# Patient Record
Sex: Male | Born: 2000 | Race: White | Hispanic: No | Marital: Single | Smoking: Never smoker
Health system: Southern US, Community
[De-identification: ages and names within clinical notes are randomized; demographics above are authoritative.]

## PROBLEM LIST (undated history)

## (undated) DIAGNOSIS — K589 Irritable bowel syndrome without diarrhea: Secondary | ICD-10-CM

## (undated) DIAGNOSIS — F419 Anxiety disorder, unspecified: Secondary | ICD-10-CM

## (undated) DIAGNOSIS — J302 Other seasonal allergic rhinitis: Secondary | ICD-10-CM

## (undated) DIAGNOSIS — K219 Gastro-esophageal reflux disease without esophagitis: Secondary | ICD-10-CM

## (undated) DIAGNOSIS — F32A Depression, unspecified: Secondary | ICD-10-CM

## (undated) DIAGNOSIS — F909 Attention-deficit hyperactivity disorder, unspecified type: Secondary | ICD-10-CM

## (undated) HISTORY — DX: Depression, unspecified: F32.A

## (undated) HISTORY — DX: Gastro-esophageal reflux disease without esophagitis: K21.9

## (undated) HISTORY — DX: Anxiety disorder, unspecified: F41.9

---
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
## 2019-05-09 ENCOUNTER — Other Ambulatory Visit: Payer: Self-pay

## 2019-05-09 DIAGNOSIS — Z20822 Contact with and (suspected) exposure to covid-19: Secondary | ICD-10-CM

## 2019-05-10 LAB — NOVEL CORONAVIRUS, NAA: SARS-CoV-2, NAA: NOT DETECTED

## 2019-10-03 ENCOUNTER — Telehealth (HOSPITAL_COMMUNITY): Payer: Self-pay | Admitting: Family Medicine

## 2019-10-03 ENCOUNTER — Encounter (HOSPITAL_COMMUNITY): Payer: Self-pay

## 2019-10-03 ENCOUNTER — Other Ambulatory Visit: Payer: Self-pay

## 2019-10-03 ENCOUNTER — Ambulatory Visit (HOSPITAL_COMMUNITY)
Admission: EM | Admit: 2019-10-03 | Discharge: 2019-10-03 | Disposition: A | Discharge status: Home / Self Care | Payer: No Typology Code available for payment source | Attending: Family Medicine | Admitting: Family Medicine

## 2019-10-03 DIAGNOSIS — E559 Vitamin D deficiency, unspecified: Secondary | ICD-10-CM | POA: Insufficient documentation

## 2019-10-03 DIAGNOSIS — R5383 Other fatigue: Secondary | ICD-10-CM | POA: Diagnosis not present

## 2019-10-03 LAB — CBC WITH DIFFERENTIAL/PLATELET
Abs Immature Granulocytes: 0.03 10*3/uL (ref 0.00–0.07)
Basophils Absolute: 0 10*3/uL (ref 0.0–0.1)
Basophils Relative: 0 %
Eosinophils Absolute: 0.1 10*3/uL (ref 0.0–0.5)
Eosinophils Relative: 1 %
HCT: 54 % — ABNORMAL HIGH (ref 39.0–52.0)
Hemoglobin: 18.1 g/dL — ABNORMAL HIGH (ref 13.0–17.0)
Immature Granulocytes: 0 %
Lymphocytes Relative: 37 %
Lymphs Abs: 2.5 10*3/uL (ref 0.7–4.0)
MCH: 28 pg (ref 26.0–34.0)
MCHC: 33.5 g/dL (ref 30.0–36.0)
MCV: 83.5 fL (ref 80.0–100.0)
Monocytes Absolute: 0.6 10*3/uL (ref 0.1–1.0)
Monocytes Relative: 9 %
Neutro Abs: 3.6 10*3/uL (ref 1.7–7.7)
Neutrophils Relative %: 53 %
Platelets: 257 10*3/uL (ref 150–400)
RBC: 6.47 MIL/uL — ABNORMAL HIGH (ref 4.22–5.81)
RDW: 12.9 % (ref 11.5–15.5)
WBC: 6.9 10*3/uL (ref 4.0–10.5)
nRBC: 0 % (ref 0.0–0.2)

## 2019-10-03 LAB — COMPREHENSIVE METABOLIC PANEL
ALT: 18 U/L (ref 0–44)
AST: 20 U/L (ref 15–41)
Albumin: 4.6 g/dL (ref 3.5–5.0)
Alkaline Phosphatase: 56 U/L (ref 38–126)
Anion gap: 10 (ref 5–15)
BUN: 13 mg/dL (ref 6–20)
CO2: 28 mmol/L (ref 22–32)
Calcium: 9.8 mg/dL (ref 8.9–10.3)
Chloride: 101 mmol/L (ref 98–111)
Creatinine, Ser: 1.21 mg/dL (ref 0.61–1.24)
GFR calc Af Amer: >60 mL/min (ref >60)
GFR calc non Af Amer: >60 mL/min (ref >60)
Glucose, Bld: 101 mg/dL — ABNORMAL HIGH (ref 70–99)
Potassium: 3.9 mmol/L (ref 3.5–5.1)
Sodium: 139 mmol/L (ref 135–145)
Total Bilirubin: 1.5 mg/dL — ABNORMAL HIGH (ref 0.3–1.2)
Total Protein: 6.8 g/dL (ref 6.5–8.1)

## 2019-10-03 LAB — VITAMIN D 25 HYDROXY (VIT D DEFICIENCY, FRACTURES): Vit D, 25-Hydroxy: 19.65 ng/mL — ABNORMAL LOW (ref 30–100)

## 2019-10-03 LAB — TSH: TSH: 1.609 u[IU]/mL (ref 0.350–4.500)

## 2019-10-03 MED ORDER — VITAMIN D (ERGOCALCIFEROL) 1.25 MG (50000 UNIT) PO CAPS: 50000.0000 [IU] | ORAL_CAPSULE | ORAL | 0 refills | Status: AC

## 2019-10-03 MED FILL — VIT D2 1.25 MG (50,000 UNIT: 1.25 MG | 56 days supply | Qty: 8 | Fill #0

## 2019-10-03 NOTE — ED Triage Notes (Signed)
Pt c/o acute onset of extreme fatigue, sleeping most Monday and Tuesday, lack of appetite/desire to eat, and HA. Generalized abdom pain onset Tuesday.  Denies n/v/d, fever, sore throat, ear pain, joint pain, nasal congestion or dysuria sx.  Denies any recent stress.

## 2019-10-03 NOTE — Discharge Instructions (Addendum)
Nothing concerning on your exam today.  We are checking some lab work to rule out other causes of fatigue. Follow-up with your primary care for any continued problems

## 2019-10-03 NOTE — ED Provider Notes (Signed)
[facility]    CSN: [id number] Arrival date & time: 10/03/19  2094      History   Chief Complaint Chief Complaint  Patient presents with  . Fatigue    HPI Jermaine Blackwell is a 19 y.o. male.   Patient is a 19 year old male presents today for acute onset of extreme fatigue for the past couple days.  Reporting sleeping most of the day Monday and Tuesday.  He has had lack of appetite and desire to eat.  Mild headache.  Generalized abdominal discomfort on Tuesday.  Denies any associated nausea, vomiting or diarrhea.  Denies any fever, sore throat, ear pain, joint pain, nasal congestion or rhinorrhea.  Denies any recent stressful situations or anxiety.  No chest pain or shortness of breath.  Past medical history of ADD, anxiety, IBS and seasonal allergies.  ROS per HPI      Past Medical History:  Diagnosis Date  . ADD (attention deficit disorder with hyperactivity)   . Anxiety   . IBS (irritable bowel syndrome)   . Seasonal allergies     Patient Active Problem List   Diagnosis Date Noted  . Generalized anxiety disorder 04/20/2018  . Abdominal pain 03/20/2018    History reviewed. No pertinent surgical history.     Home Medications    Prior to Admission medications   Medication Sig Start Date End Date Taking? Authorizing Provider  ANASPAZ 0.125 MG TBDP disintergrating tablet TAKE 1 TABLET BY MOUTH EVERY 4 HOURS AS NEEDED FOR ABDOMINAL CRAMPING CAN REPEAT ONCE IN 30 MINUTES IFNO RELIEF 03/02/18   [provider]  benzonatate (TESSALON PERLES) 100 MG capsule Take 1 capsule (100 mg total) by mouth 3 (three) times daily as needed. 09/11/18   Junie Spencer, FNP  fluticasone (FLONASE) 50 MCG/ACT nasal spray Place 2 sprays into both nostrils daily. 09/11/18   Junie Spencer, FNP    Family History Family History  Problem Relation Age of Onset  . Diabetes Mother   . Anxiety disorder Mother   . Colitis Mother   . Lactose intolerance Mother   . Diabetes  Father   . Hypertension Father   . Gallstones Paternal Grandfather   . Bipolar disorder Maternal Grandmother   . Depression Paternal Grandmother     Social History Social History   Tobacco Use  . Smoking status: Never Smoker  . Smokeless tobacco: Never Used  Substance Use Topics  . Alcohol use: No  . Drug use: No     Allergies   Patient has no known allergies.   Review of Systems Review of Systems   Physical Exam Triage Vital Signs ED Triage Vitals  Enc Vitals Group     BP 10/03/19 0829 118/82     Pulse Rate 10/03/19 0829 87     Resp 10/03/19 0829 18     Temp 10/03/19 0829 98 F (36.7 C)     Temp Source 10/03/19 0829 Oral     SpO2 10/03/19 0829 99 %     Weight --      Height --      Head Circumference --      Peak Flow --      Pain Score 10/03/19 0830 4     Pain Loc --      Pain Edu? --      Excl. in GC? --    No data found.  Updated Vital Signs BP 118/82 (BP Location: Left Arm)   Pulse 87   Temp  98 F (36.7 C) (Oral)   Resp 18   SpO2 99%   Visual Acuity Right Eye Distance:   Left Eye Distance:   Bilateral Distance:    Right Eye Near:   Left Eye Near:    Bilateral Near:     Physical Exam Vitals and nursing note reviewed.  Constitutional:      Appearance: Normal appearance.  HENT:     Head: Normocephalic and atraumatic.     Right Ear: Tympanic membrane and ear canal normal.     Left Ear: Tympanic membrane and ear canal normal.     Nose: Nose normal.     Mouth/Throat:     Mouth: Mucous membranes are moist.     Pharynx: No posterior oropharyngeal erythema.  Eyes:     Extraocular Movements: Extraocular movements intact.     Conjunctiva/sclera: Conjunctivae normal.     Pupils: Pupils are equal, round, and reactive to light.  Cardiovascular:     Rate and Rhythm: Normal rate and regular rhythm.     Pulses: Normal pulses.     Heart sounds: Normal heart sounds.  Pulmonary:     Effort: Pulmonary effort is normal.     Breath sounds: Normal  breath sounds.  Abdominal:     Palpations: Abdomen is soft.     Tenderness: There is no abdominal tenderness.  Musculoskeletal:        General: Normal range of motion.     Cervical back: Normal range of motion.  Skin:    General: Skin is warm and dry.     Coloration: Skin is pale.  Neurological:     Mental Status: He is alert.  Psychiatric:        Mood and Affect: Mood normal.      UC Treatments / Results  Labs (all labs ordered are listed, but only abnormal results are displayed) Labs Reviewed  CBC WITH DIFFERENTIAL/PLATELET - Abnormal; Notable for the following components:      Result Value   RBC 6.47 (*)    Hemoglobin 18.1 (*)    HCT 54.0 (*)    All other components within normal limits  COMPREHENSIVE METABOLIC PANEL - Abnormal; Notable for the following components:   Glucose, Bld 101 (*)    Total Bilirubin 1.5 (*)    All other components within normal limits  VITAMIN D 25 HYDROXY (VIT D DEFICIENCY, FRACTURES) - Abnormal; Notable for the following components:   Vit D, 25-Hydroxy 19.65 (*)    All other components within normal limits  TSH    EKG   Radiology No results found.  Procedures Procedures (including critical care time)  Medications Ordered in UC Medications - No data to display  Initial Impression / Assessment and Plan / UC Course  I have reviewed the triage vital signs and the nursing notes.  Pertinent labs & imaging results that were available during my care of the patient were reviewed by me and considered in my medical decision making (see chart for details).     Fatigue-exam benign.  No red flags.  Vital signs stable and he is nontoxic or ill-appearing. Lab work mostly unremarkable besides low vitamin D.  This could be the cause of his fatigue.  Will have him start vitamin D supplement.  Otherwise nothing concerning today and he can follow up with primary care as needed for recheck after treatment.     Final Clinical Impressions(s) / UC  Diagnoses   Final diagnoses:  Fatigue, unspecified type  Discharge Instructions     Nothing concerning on your exam today.  We are checking some lab work to rule out other causes of fatigue. Follow-up with your primary care for any continued problems    ED Prescriptions    None     PDMP not reviewed this encounter.   Loura Halt A, NP 10/03/19 1406

## 2019-10-03 NOTE — Telephone Encounter (Signed)
Vitamin D deficiency noted Sending 50,000 iu weekly x8 weeks to pharmacy for treatment Otherwise other lab work unremarkable

## 2020-01-01 MED FILL — HYDROCODON-APAP 5-325: 5-325 | 1 days supply | Qty: 5 | Fill #0

## 2020-01-01 MED FILL — AMOXICILLIN 500 MG CAPSULE: 500 | 5 days supply | Qty: 15 | Fill #0

## 2020-01-01 MED FILL — CHLORHEXIDINE 0.12% RINSE: 0.12 | 17 days supply | Qty: 473 | Fill #0

## 2020-02-08 ENCOUNTER — Other Ambulatory Visit: Payer: Self-pay

## 2020-02-08 ENCOUNTER — Encounter (HOSPITAL_COMMUNITY): Payer: Self-pay

## 2020-02-08 ENCOUNTER — Ambulatory Visit (HOSPITAL_COMMUNITY)
Admission: EM | Admit: 2020-02-08 | Discharge: 2020-02-08 | Disposition: A | Discharge status: Home / Self Care | Payer: No Typology Code available for payment source | Attending: Family Medicine | Admitting: Family Medicine

## 2020-02-08 DIAGNOSIS — I808 Phlebitis and thrombophlebitis of other sites: Secondary | ICD-10-CM | POA: Diagnosis not present

## 2020-02-08 NOTE — Discharge Instructions (Addendum)
Ibuprofen if needed pain Warmth to area

## 2020-02-08 NOTE — ED Triage Notes (Signed)
Pt c/o swelling, bruising and tenderness to left arm vein proximal to antecubital fossa. Pt states he had an IV for wisdom tooth extraction on 01/05/20. Denies fever, chills, n/v/d. No OTC meds, ice or heat used for symptoms.

## 2020-02-08 NOTE — ED Provider Notes (Signed)
[facility]    CSN: [id number] Arrival date & time: 02/08/20  1730      History   Chief Complaint Chief Complaint  Patient presents with  . possible phlebitis    HPI Jermaine Blackwell is a 19 y.o. male.   HPI  Patient had his wisdom teeth pulled on 01/05/2020.  He had an IV in place.  He states that recently he has developed increasing discoloration tenderness and firmness of the involved vein.  He looked it up on the Internet.  He is thrombophlebitis.  This concerned him somewhat so he is here for evaluation.  He is otherwise well  Past Medical History:  Diagnosis Date  . ADD (attention deficit disorder with hyperactivity)   . Anxiety   . IBS (irritable bowel syndrome)   . Seasonal allergies     Patient Active Problem List   Diagnosis Date Noted  . Generalized anxiety disorder 04/20/2018  . Abdominal pain 03/20/2018    History reviewed. No pertinent surgical history.     Home Medications    Prior to Admission medications   Medication Sig Start Date End Date Taking? Authorizing Provider  ANASPAZ 0.125 MG TBDP disintergrating tablet TAKE 1 TABLET BY MOUTH EVERY 4 HOURS AS NEEDED FOR ABDOMINAL CRAMPING CAN REPEAT ONCE IN 30 MINUTES IFNO RELIEF 03/02/18 02/08/20  [provider]  fluticasone (FLONASE) 50 MCG/ACT nasal spray Place 2 sprays into both nostrils daily. 09/11/18 02/08/20  Junie Spencer, FNP    Family History Family History  Problem Relation Age of Onset  . Diabetes Mother   . Anxiety disorder Mother   . Colitis Mother   . Lactose intolerance Mother   . Diabetes Father   . Hypertension Father   . Gallstones Paternal Grandfather   . Bipolar disorder Maternal Grandmother   . Depression Paternal Grandmother     Social History Social History   Tobacco Use  . Smoking status: Never Smoker  . Smokeless tobacco: Never Used  Substance Use Topics  . Alcohol use: No  . Drug use: No     Allergies   Patient has no known  allergies.   Review of Systems Review of Systems See HPI  Physical Exam Triage Vital Signs ED Triage Vitals  Enc Vitals Group     BP 02/08/20 1802 123/84     Pulse Rate 02/08/20 1802 95     Resp 02/08/20 1802 18     Temp 02/08/20 1802 98.3 F (36.8 C)     Temp Source 02/08/20 1802 Oral     SpO2 02/08/20 1802 99 %     Weight --      Height --      Head Circumference --      Peak Flow --      Pain Score 02/08/20 1803 3     Pain Loc --      Pain Edu? --      Excl. in GC? --    No data found.  Updated Vital Signs BP 123/84 (BP Location: Right Arm)   Pulse 95   Temp 98.3 F (36.8 C) (Oral)   Resp 18   SpO2 99%       Physical Exam Constitutional:      General: He is not in acute distress.    Appearance: He is well-developed and normal weight.  HENT:     Head: Normocephalic and atraumatic.     Mouth/Throat:     Comments: Mask is in place Eyes:  Conjunctiva/sclera: Conjunctivae normal.     Pupils: Pupils are equal, round, and reactive to light.  Cardiovascular:     Rate and Rhythm: Normal rate.  Pulmonary:     Effort: Pulmonary effort is normal. No respiratory distress.  Abdominal:     General: There is no distension.     Palpations: Abdomen is soft.  Musculoskeletal:        General: Normal range of motion.     Cervical back: Normal range of motion.  Skin:    General: Skin is warm and dry.     Comments: There is a visible and palpable superficial thrombophlebitis in a linear fashion from the antecubital fossa up this mid bicep region on the left.  Minimal tenderness.  Resolving ecchymosis.  Neurological:     Mental Status: He is alert.  Psychiatric:        Mood and Affect: Mood normal.      UC Treatments / Results  Labs (all labs ordered are listed, but only abnormal results are displayed) Labs Reviewed - No data to display  EKG   Radiology No results found.  Procedures Procedures (including critical care time)  Medications Ordered in  UC Medications - No data to display  Initial Impression / Assessment and Plan / UC Course  I have reviewed the triage vital signs and the nursing notes.  Pertinent labs & imaging results that were available during my care of the patient were reviewed by me and considered in my medical decision making (see chart for details).     Reviewed the benign nature of superficial thrombophlebitis.  No worries Final Clinical Impressions(s) / UC Diagnoses   Final diagnoses:  Superficial thrombophlebitis of left upper extremity     Discharge Instructions     Ibuprofen if needed pain Warmth to area   ED Prescriptions    None     PDMP not reviewed this encounter.   Eustace Moore, MD 02/08/20 2229

## 2020-03-10 IMAGING — US US ABDOMEN COMPLETE
1 series · 14 of 25 positions shown · non-contrast
Comparison: 03/29/2000.

CLINICAL DATA: Diffuse abdominal pain.

EXAM:
ABDOMEN ULTRASOUND COMPLETE

[Series 1: us abdomen complete · 0.17mm/px · 14 of 102 slices shown]
[im 1/102]
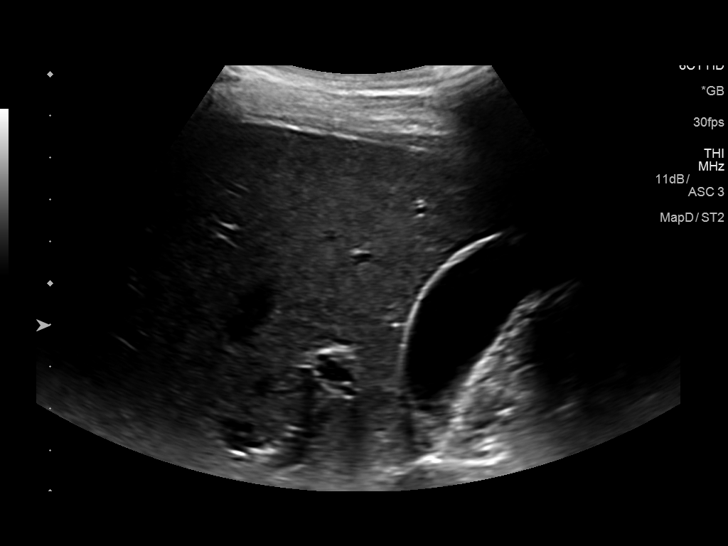
[im 9/102]
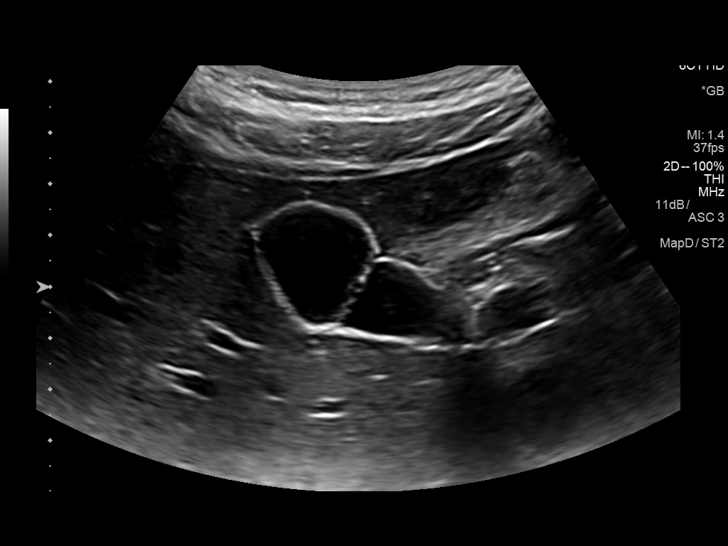
[im 17/102]
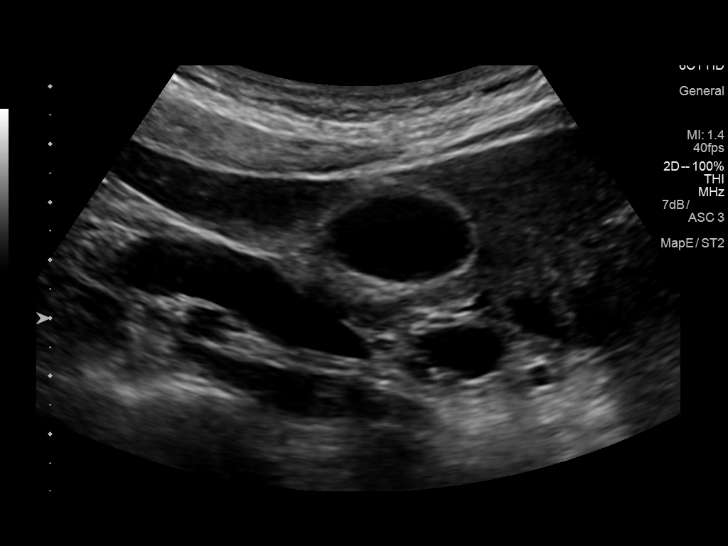
[im 26/102]
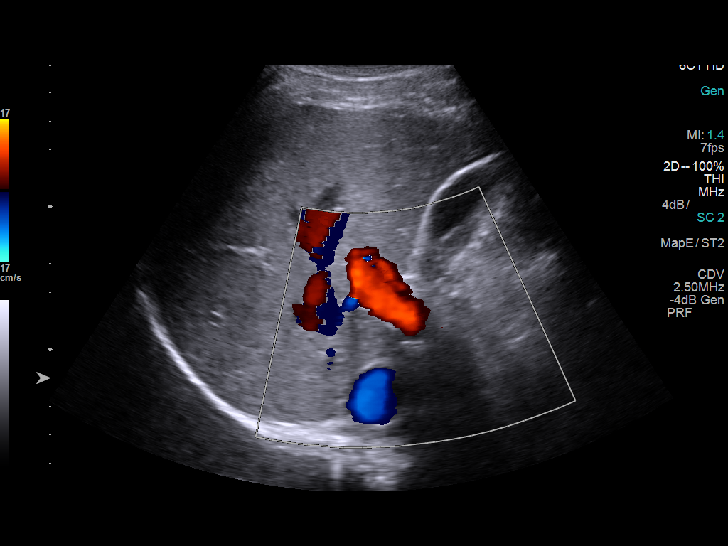
[im 34/102]
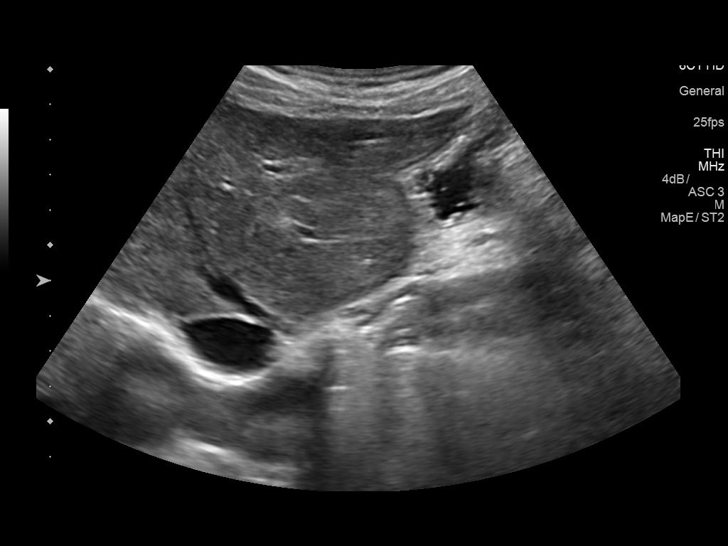
[im 38/102]
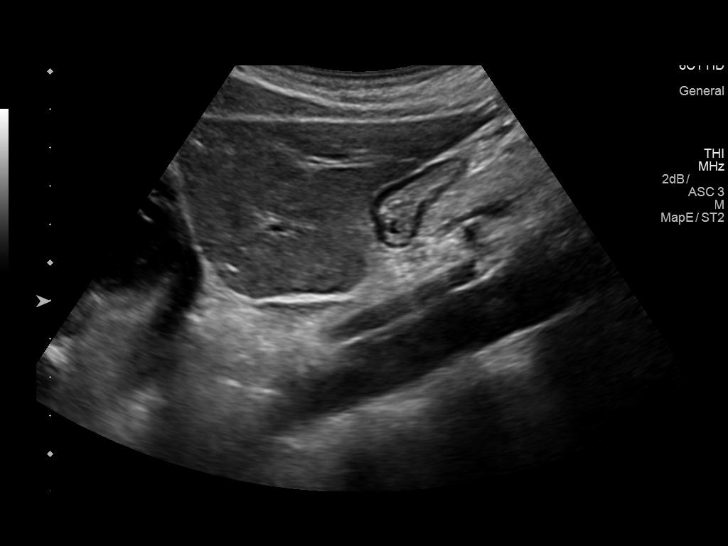
[im 47/102]
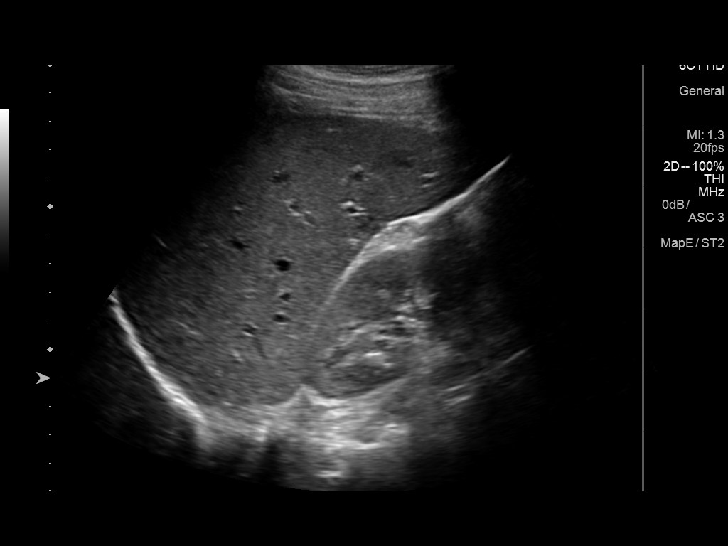
[im 55/102]
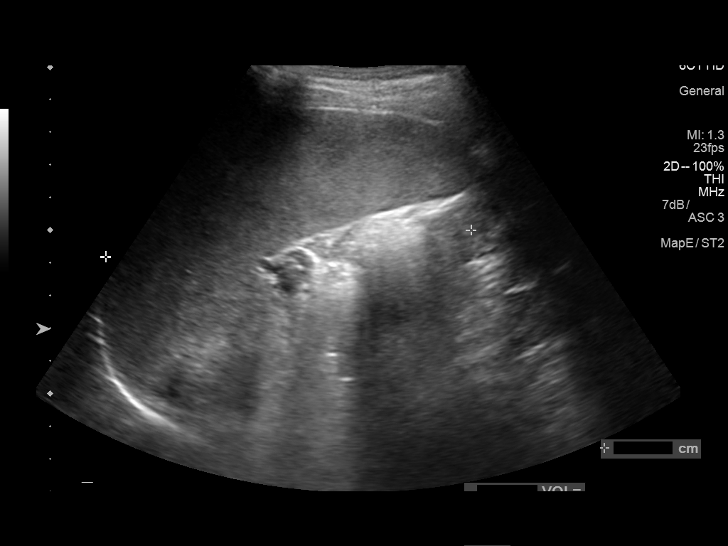
[im 64/102]
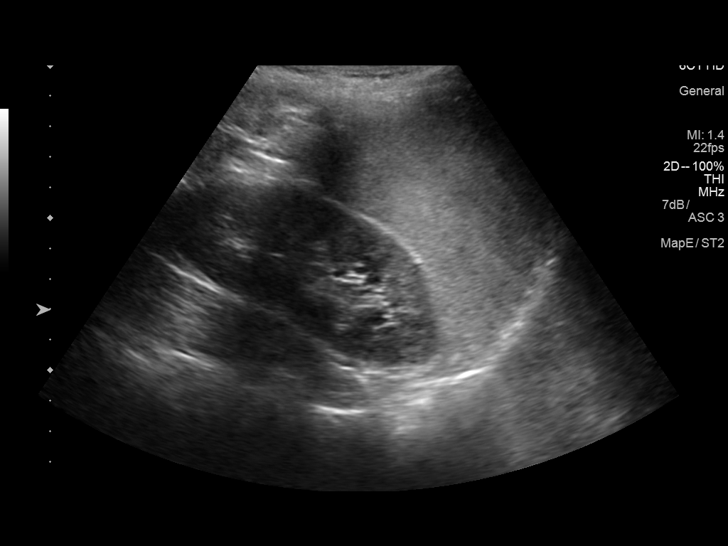
[im 68/102]
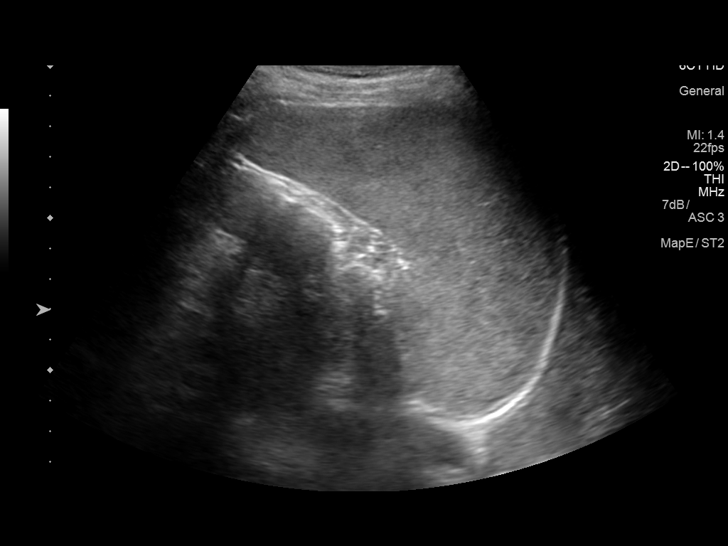
[im 76/102]
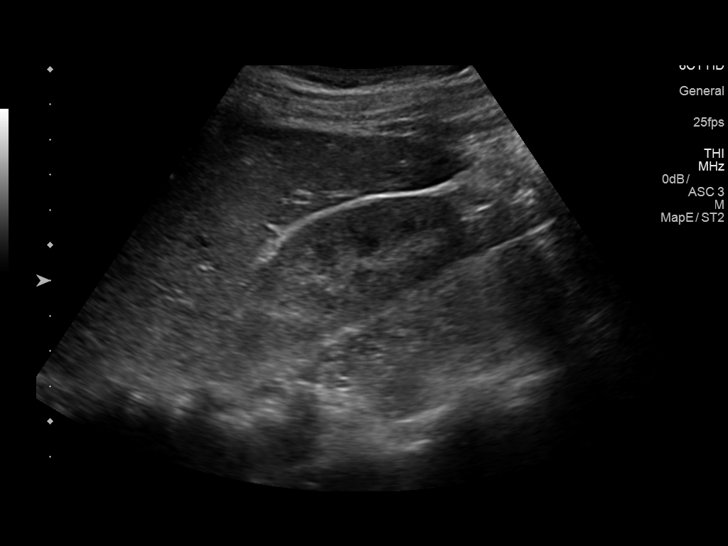
[im 85/102]
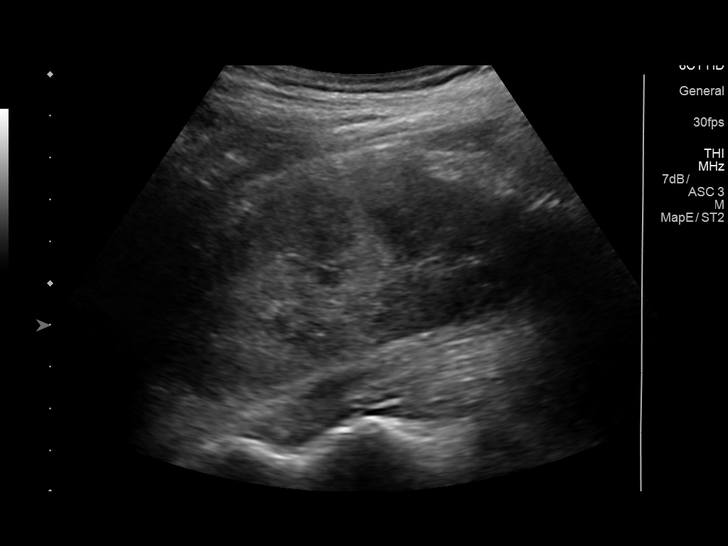
[im 93/102]
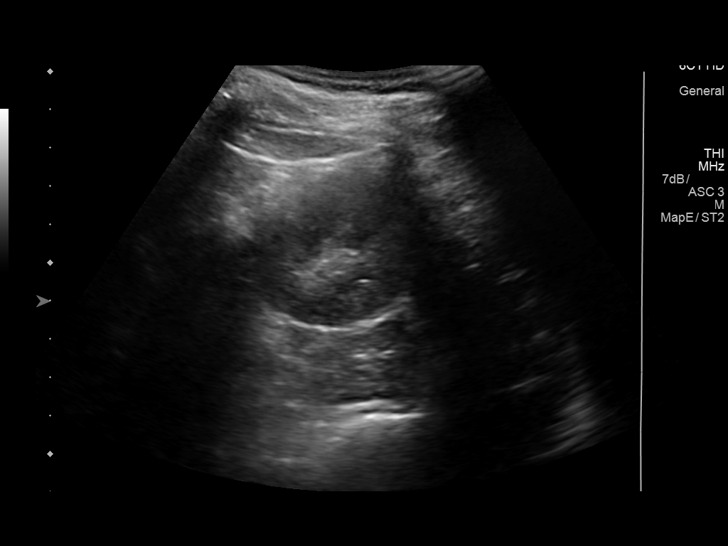
[im 102/102]
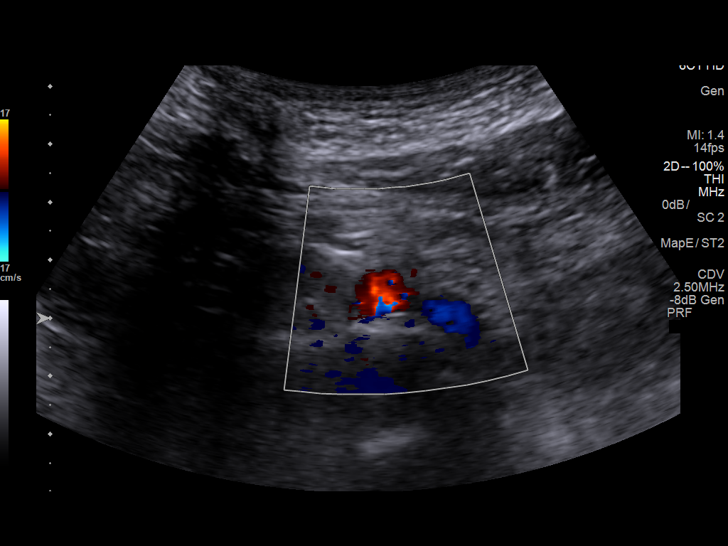

[14 of 25 positions shown; findings below may reference images not displayed]

FINDINGS: Gallbladder: No gallstones or wall thickening visualized. No
sonographic Murphy sign noted by sonographer.

Common bile duct: Diameter: 1.0 mm

Liver: No focal lesion identified. Within normal limits in
parenchymal echogenicity. Portal vein is patent on color Doppler
imaging with normal direction of blood flow towards the liver.

IVC: No abnormality visualized.

Pancreas: Visualized portion unremarkable.

Spleen: Size and appearance within normal limits.

Right Kidney: Length: 9.2 cm. Echogenicity within normal limits. No
mass or hydronephrosis visualized.

Left Kidney: Length: 9.7 cm. Echogenicity within normal limits. No
mass or hydronephrosis visualized.

Abdominal aorta: No aneurysm visualized.

Other findings: None.
IMPRESSION: Normal examination.

## 2020-12-10 ENCOUNTER — Ambulatory Visit (INDEPENDENT_AMBULATORY_CARE_PROVIDER_SITE_OTHER): Payer: No Typology Code available for payment source | Admitting: Nurse Practitioner

## 2020-12-10 ENCOUNTER — Encounter: Payer: Self-pay | Admitting: Nurse Practitioner

## 2020-12-10 ENCOUNTER — Other Ambulatory Visit: Payer: Self-pay

## 2020-12-10 VITALS — BP 110/68 | HR 97 | Temp 98.0°F | Ht 67.2 in | Wt 141.6 lb

## 2020-12-10 DIAGNOSIS — Z0000 Encounter for general adult medical examination without abnormal findings: Secondary | ICD-10-CM

## 2020-12-10 DIAGNOSIS — Z789 Other specified health status: Secondary | ICD-10-CM

## 2020-12-10 DIAGNOSIS — Z7689 Persons encountering health services in other specified circumstances: Secondary | ICD-10-CM | POA: Diagnosis not present

## 2020-12-10 NOTE — Patient Instructions (Signed)
Health Maintenance, Male Adopting a healthy lifestyle and getting preventive care are important in promoting health and wellness. Ask your health care provider about: The right schedule for you to have regular tests and exams. Things you can do on your own to prevent diseases and keep yourself healthy. What should I know about diet, weight, and exercise? Eat a healthy diet  Eat a diet that includes plenty of vegetables, fruits, low-fat dairy products, and lean protein. Do not eat a lot of foods that are high in solid fats, added sugars, or sodium.  Maintain a healthy weight Body mass index (BMI) is a measurement that can be used to identify possible weight problems. It estimates body fat based on height and weight. Your health care provider can help determine your BMI and help you achieve or maintain ahealthy weight. Get regular exercise Get regular exercise. This is one of the most important things you can do for your health. Most adults should: Exercise for at least 150 minutes each week. The exercise should increase your heart rate and make you sweat (moderate-intensity exercise). Do strengthening exercises at least twice a week. This is in addition to the moderate-intensity exercise. Spend less time sitting. Even light physical activity can be beneficial. Watch cholesterol and blood lipids Have your blood tested for lipids and cholesterol at 20 years of age, then havethis test every 5 years. You may need to have your cholesterol levels checked more often if: Your lipid or cholesterol levels are high. You are older than 21 years of age. You are at high risk for heart disease. What should I know about cancer screening? Many types of cancers can be detected early and may often be prevented. Depending on your health history and family history, you may need to have cancer screening at various ages. This may include screening for: Colorectal cancer. Prostate cancer. Skin cancer. Lung  cancer. What should I know about heart disease, diabetes, and high blood pressure? Blood pressure and heart disease High blood pressure causes heart disease and increases the risk of stroke. This is more likely to develop in people who have high blood pressure readings, are of African descent, or are overweight. Talk with your health care provider about your target blood pressure readings. Have your blood pressure checked: Every 3-5 years if you are 18-39 years of age. Every year if you are 40 years old or older. If you are between the ages of 65 and 75 and are a current or former smoker, ask your health care provider if you should have a one-time screening for abdominal aortic aneurysm (AAA). Diabetes Have regular diabetes screenings. This checks your fasting blood sugar level. Have the screening done: Once every three years after age 45 if you are at a normal weight and have a low risk for diabetes. More often and at a younger age if you are overweight or have a high risk for diabetes. What should I know about preventing infection? Hepatitis B If you have a higher risk for hepatitis B, you should be screened for this virus. Talk with your health care provider to find out if you are at risk forhepatitis B infection. Hepatitis C Blood testing is recommended for: Everyone born from 1945 through 1965. Anyone with known risk factors for hepatitis C. Sexually transmitted infections (STIs) You should be screened each year for STIs, including gonorrhea and chlamydia, if: You are sexually active and are younger than 21 years of age. You are older than 21 years of age   and your health care provider tells you that you are at risk for this type of infection. Your sexual activity has changed since you were last screened, and you are at increased risk for chlamydia or gonorrhea. Ask your health care provider if you are at risk. Ask your health care provider about whether you are at high risk for HIV.  Your health care provider may recommend a prescription medicine to help prevent HIV infection. If you choose to take medicine to prevent HIV, you should first get tested for HIV. You should then be tested every 3 months for as long as you are taking the medicine. Follow these instructions at home: Lifestyle Do not use any products that contain nicotine or tobacco, such as cigarettes, e-cigarettes, and chewing tobacco. If you need help quitting, ask your health care provider. Do not use street drugs. Do not share needles. Ask your health care provider for help if you need support or information about quitting drugs. Alcohol use Do not drink alcohol if your health care provider tells you not to drink. If you drink alcohol: Limit how much you have to 0-2 drinks a day. Be aware of how much alcohol is in your drink. In the [place] one drink equals one 12 oz bottle of beer (355 mL), one 5 oz glass of wine (148 mL), or one 1 oz glass of hard liquor (44 mL). General instructions Schedule regular health, dental, and eye exams. Stay current with your vaccines. Tell your health care provider if: You often feel depressed. You have ever been abused or do not feel safe at home. Summary Adopting a healthy lifestyle and getting preventive care are important in promoting health and wellness. Follow your health care provider's instructions about healthy diet, exercising, and getting tested or screened for diseases. Follow your health care provider's instructions on monitoring your cholesterol and blood pressure. This information is not intended to replace advice given to you by your health care provider. Make sure you discuss any questions you have with your healthcare provider. Document Revised: 05/31/2018 Document Reviewed: 05/31/2018 Elsevier Patient Education  2022 [employer]  

## 2020-12-10 NOTE — Progress Notes (Signed)
I,Tianna Badgett,acting as a Education administrator for [employer], NP.,have documented all relevant documentation on the behalf of [employer], NP,as directed by  Bary Castilla, NP while in the presence of Bary Castilla, NP.  This visit occurred during the SARS-CoV-2 public health emergency.  Safety protocols were in place, including screening questions prior to the visit, additional usage of staff PPE, and extensive cleaning of exam room while observing appropriate contact time as indicated for disinfecting solutions.  Subjective:     Patient ID: Jermaine Blackwell , male    DOB: [birth year] , 20 y.o.   MRN: [id number]   Chief Complaint  Patient presents with   Establish Care    HPI  Patient is here to establish care. He would also like forms filled out today for school. [employer]. Integrated major.  He has no concerns .  Pt. Is not sexually active.  Diet: Fast food. Chicken or [place]. Doesn't eat beef. He just eats whatever he can find.  Exercise: He doesn't exercise. He does not smoke or drink.  He goes to the dentist and yearly eye exams.     Past Medical History:  Diagnosis Date   ADD (attention deficit disorder with hyperactivity)    Anxiety    IBS (irritable bowel syndrome)    Seasonal allergies      Family History  Problem Relation Age of Onset   Anxiety disorder Mother    Colitis Mother    Lactose intolerance Mother    Diabetes Father    Hypertension Father    Bipolar disorder Maternal Grandmother    Depression Paternal Grandmother    Diabetes Paternal Grandfather    Gallstones Paternal Grandfather     No current outpatient medications on file.   No Known Allergies   Men's preventive visit. Patient Health Questionnaire (PHQ-2) is  [place] Office Visit from 12/10/2020 in [facility]  PHQ-2 Total Score 0     . Patient is on a not on any diet. Marital status: Single. Relevant history for alcohol use is:  Social History    Substance and Sexual Activity  Alcohol Use No  . Relevant history for tobacco use is:  Social History   Tobacco Use  Smoking Status Never  Smokeless Tobacco Never  .   Review of Systems  Constitutional: Negative.  Negative for chills and fever.  HENT: Negative.  Negative for congestion, postnasal drip, rhinorrhea, sinus pressure and sinus pain.   Eyes: Negative.   Respiratory: Negative.  Negative for apnea, cough, shortness of breath and wheezing.   Cardiovascular: Negative.  Negative for chest pain and palpitations.  Gastrointestinal: Negative.  Negative for diarrhea and nausea.  Endocrine: Negative.  Negative for polydipsia, polyphagia and polyuria.  Genitourinary: Negative.   Musculoskeletal: Negative.  Negative for arthralgias, back pain and myalgias.  Skin: Negative.   Allergic/Immunologic: Negative.   Neurological: Negative.  Negative for dizziness, weakness, numbness and headaches.  Hematological: Negative.   Psychiatric/Behavioral: Negative.      Today's Vitals   12/10/20 1108  BP: 110/68  Pulse: 97  Temp: 98 F (36.7 C)  TempSrc: Oral  Weight: 141 lb 9.6 oz (64.2 kg)  Height: 5' 7.2" (1.707 m)   Body mass index is 22.05 kg/m.  Wt Readings from Last 3 Encounters:  12/10/20 141 lb 9.6 oz (64.2 kg)  09/04/18 141 lb 12.8 oz (64.3 kg) (36 %, Z= -0.36)*  03/20/18 138 lb 6.4 oz (62.8 kg) (33 %, Z= -0.43)*   * Growth  percentiles are based on CDC (Boys, 2-20 Years) data.    Objective:  Physical Exam Vitals and nursing note reviewed.  Constitutional:      Appearance: Normal appearance.  HENT:     Head: Normocephalic and atraumatic.     Right Ear: Tympanic membrane, ear canal and external ear normal. There is no impacted cerumen.     Left Ear: Tympanic membrane, ear canal and external ear normal. There is no impacted cerumen.     Nose: Nose normal. No congestion or rhinorrhea.     Mouth/Throat:     Mouth: Mucous membranes are moist.     Pharynx: Oropharynx  is clear.  Eyes:     Extraocular Movements: Extraocular movements intact.     Conjunctiva/sclera: Conjunctivae normal.     Pupils: Pupils are equal, round, and reactive to light.  Cardiovascular:     Rate and Rhythm: Normal rate and regular rhythm.     Pulses: Normal pulses.     Heart sounds: Normal heart sounds. No murmur heard. Pulmonary:     Effort: Pulmonary effort is normal. No respiratory distress.     Breath sounds: Normal breath sounds. No wheezing.  Chest:  Breasts:    Right: Normal. No swelling, bleeding, inverted nipple, mass or nipple discharge.     Left: Normal. No swelling, bleeding, inverted nipple, mass or nipple discharge.  Abdominal:     General: Abdomen is flat. Bowel sounds are normal.     Palpations: Abdomen is soft.  Genitourinary:    Penis: Normal.      Testes: Normal.     Prostate: Normal.     Rectum: Normal. Guaiac result negative.  Musculoskeletal:        General: Normal range of motion.     Cervical back: Normal range of motion and neck supple.  Skin:    General: Skin is warm.  Neurological:     General: No focal deficit present.     Mental Status: He is alert.  Psychiatric:        Mood and Affect: Mood normal.        Behavior: Behavior normal.        Assessment And Plan:    1. Encounter to establish care -Patient is here to establish care. Gary Majestic over patient medical, family, social and surgical history. -Reviewed with patient their medications and any allergies  -Reviewed with patient their sexual orientation, drug/tobacco and alcohol use -Dicussed any new concerns with patient  -recommended patient comes in for a physical exam and complete blood work.  -Educated patient about the importance of annual screenings and immunizations.  -Advised patient to eat a healthy diet along with exercise for atleast 30-45 min atleast 4-5 days of the week.  -Patient was also here today to get his immunization filled out for his college.   2. Annual  physical exam --Patient is here for their annual physical exam and we discussed any changes to medication and medical history.  -Behavior modification was discussed as well as diet and exercise history  -Patient will continue to exercise regularly and modify their diet.  -Recommendation for yearly physical annuals, immunization and screenings including mammogram and colonoscopy were discussed with the patient.  -Recommended intake of multivitamin, vitamin D and calcium.  -Individualized advise was given to the patient pertaining to their own health history in regards to diet, exercise, medical condition and referrals.  - CBC - Hemoglobin A1c - CMP14+EGFR - Lipid panel - Hepatitis C antibody - HIV Antibody (routine  testing w rflx)  3. Varicella vaccination status unknown - Varicella zoster antibody, IgG  The patient was encouraged to call or send a message through MyChart for any questions or concerns.   Follow up: 1 year   Staying healthy and adopting a healthy lifestyle for your overall health is important. You should eat 7 or more servings of fruits and vegetables per day. You should drink plenty of water to keep yourself hydrated and your kidneys healthy. This includes about 65-80+ fluid ounces of water. Limit your intake of animal fats especially for elevated cholesterol. Avoid highly processed food and limit your salt intake if you have hypertension. Avoid foods high in saturated/Trans fats. Along with a healthy diet it is also very important to maintain time for yourself to maintain a healthy mental health with low stress levels. You should get atleast 150 min of moderate intensity exercise weekly for a healthy heart. Along with eating right and exercising, aim for at least 7-9 hours of sleep daily.  Eat more whole grains which includes barley, wheat berries, oats, brown rice and whole wheat pasta. Use healthy plant oils which include olive, soy, corn, sunflower and peanut. Limit your  caffeine and sugary drinks. Limit your intake of fast foods. Limit milk and dairy products to one or two daily servings.   Patient was given opportunity to ask questions. Patient verbalized understanding of the plan and was able to repeat key elements of the plan. All questions were answered to their satisfaction.  Raman Jezreel Justiniano, DNP   I, Raman Nyheim Seufert have reviewed all documentation for this visit. The documentation on 12/10/20 for the exam, diagnosis, procedures, and orders are all accurate and complete.   THE PATIENT IS ENCOURAGED TO PRACTICE SOCIAL DISTANCING DUE TO THE COVID-19 PANDEMIC.

## 2020-12-11 LAB — CMP14+EGFR
ALT: 18 IU/L (ref 0–44)
AST: 17 IU/L (ref 0–40)
Albumin/Globulin Ratio: 2.6 — ABNORMAL HIGH (ref 1.2–2.2)
Albumin: 5.2 g/dL (ref 4.1–5.2)
Alkaline Phosphatase: 64 IU/L (ref 51–125)
BUN/Creatinine Ratio: 12 (ref 9–20)
BUN: 15 mg/dL (ref 6–20)
Bilirubin Total: 0.6 mg/dL (ref 0.0–1.2)
CO2: 22 mmol/L (ref 20–29)
Calcium: 10 mg/dL (ref 8.7–10.2)
Chloride: 102 mmol/L (ref 96–106)
Creatinine, Ser: 1.21 mg/dL (ref 0.76–1.27)
Globulin, Total: 2 g/dL (ref 1.5–4.5)
Glucose: 95 mg/dL (ref 65–99)
Potassium: 4 mmol/L (ref 3.5–5.2)
Sodium: 143 mmol/L (ref 134–144)
Total Protein: 7.2 g/dL (ref 6.0–8.5)
eGFR: 88 mL/min/{1.73_m2} (ref >59)

## 2020-12-11 LAB — CBC
Hematocrit: 51.3 % — ABNORMAL HIGH (ref 37.5–51.0)
Hemoglobin: 17.6 g/dL (ref 13.0–17.7)
MCH: 28.1 pg (ref 26.6–33.0)
MCHC: 34.3 g/dL (ref 31.5–35.7)
MCV: 82 fL (ref 79–97)
Platelets: 245 10*3/uL (ref 150–450)
RBC: 6.27 x10E6/uL — ABNORMAL HIGH (ref 4.14–5.80)
RDW: 13.2 % (ref 11.6–15.4)
WBC: 6.3 10*3/uL (ref 3.4–10.8)

## 2020-12-11 LAB — VARICELLA ZOSTER ANTIBODY, IGG: Varicella zoster IgG: 590 index (ref >165)

## 2020-12-11 LAB — HEPATITIS C ANTIBODY: Hep C Virus Ab: <0.1 s/co ratio (ref 0.0–0.9)

## 2020-12-11 LAB — HIV ANTIBODY (ROUTINE TESTING W REFLEX): HIV Screen 4th Generation wRfx: NONREACTIVE

## 2020-12-11 LAB — HEMOGLOBIN A1C
Est. average glucose Bld gHb Est-mCnc: 94 mg/dL
Hgb A1c MFr Bld: 4.9 % (ref 4.8–5.6)

## 2021-03-10 ENCOUNTER — Telehealth: Payer: Self-pay

## 2021-03-10 NOTE — Telephone Encounter (Signed)
The patient was scheduled a virtual visit for possible covid

## 2021-03-11 ENCOUNTER — Telehealth (INDEPENDENT_AMBULATORY_CARE_PROVIDER_SITE_OTHER): Payer: No Typology Code available for payment source | Admitting: Nurse Practitioner

## 2021-03-11 ENCOUNTER — Encounter: Payer: Self-pay | Admitting: Nurse Practitioner

## 2021-03-11 ENCOUNTER — Other Ambulatory Visit (HOSPITAL_COMMUNITY): Payer: Self-pay

## 2021-03-11 VITALS — Ht 67.2 in

## 2021-03-11 DIAGNOSIS — R059 Cough, unspecified: Secondary | ICD-10-CM | POA: Diagnosis not present

## 2021-03-11 DIAGNOSIS — R0981 Nasal congestion: Secondary | ICD-10-CM | POA: Diagnosis not present

## 2021-03-11 MED ORDER — AMOXICILLIN-POT CLAVULANATE 875-125 MG PO TABS
1.0000 | ORAL_TABLET | Freq: Two times a day (BID) | ORAL | 0 refills | Status: DC
  Filled 2021-03-11: qty 14, 7d supply, fill #0

## 2021-03-11 MED ORDER — BENZONATATE 100 MG PO CAPS
100.0000 mg | ORAL_CAPSULE | Freq: Three times a day (TID) | ORAL | 1 refills | Status: DC | PRN
  Filled 2021-03-11: qty 30, 10d supply, fill #0

## 2021-03-11 MED ORDER — GUAIFENESIN-DM 100-10 MG/5ML PO SYRP
5.0000 mL | ORAL_SOLUTION | ORAL | 0 refills | Status: DC | PRN
  Filled 2021-03-11: qty 118, 4d supply, fill #0

## 2021-03-11 NOTE — Progress Notes (Signed)
Virtual Visit via 100% video    This visit type was conducted due to national recommendations for restrictions regarding the COVID-19 Pandemic (e.g. social distancing) in an effort to limit this patient's exposure and mitigate transmission in our community.  Due to his co-morbid illnesses, this patient is at least at moderate risk for complications without adequate follow up.  This format is felt to be most appropriate for this patient at this time.  All issues noted in this document were discussed and addressed.  A limited physical exam was performed with this format.    This visit type was conducted due to national recommendations for restrictions regarding the COVID-19 Pandemic (e.g. social distancing) in an effort to limit this patient's exposure and mitigate transmission in our community.  Patients identity confirmed using two different identifiers.  This format is felt to be most appropriate for this patient at this time.  All issues noted in this document were discussed and addressed.  No physical exam was performed (except for noted visual exam findings with Video Visits).    Date:  03/11/2021   ID:  Jermaine Blackwell, DOB 03-Jul-2000, MRN [id number]  Patient Location:  Home   Provider location:   Office   Chief Complaint:  He has sinus congestion, some cough, no fever, no shortness of breath, Tested neg for covid yesterday.   History of Present Illness:    Jermaine Blackwell is a 20 y.o. male who presents via video conferencing for a telehealth visit today.    The patient does not have symptoms concerning for COVID-19 infection (fever, chills, cough, or new shortness of breath).   The patient is having a vitural visit, the patient states he has a sore throat and he was having a headaches up until yesterday, he has congestion and a runny nose. He did a rapid test for covid and it was negative. No fever. No SOB, chest pain. He is vaccinated and booster. Symptoms started Tuesday of last  week. He is coughing. No earaches.     Past Medical History:  Diagnosis Date   ADD (attention deficit disorder with hyperactivity)    Anxiety    IBS (irritable bowel syndrome)    Seasonal allergies    No past surgical history on file.   Current Meds  Medication Sig   amoxicillin-clavulanate (AUGMENTIN) 875-125 MG tablet Take 1 tablet by mouth 2 (two) times daily for 7 days.   benzonatate (TESSALON PERLES) 100 MG capsule Take 1 capsule (100 mg total) by mouth 3 (three) times daily as needed for cough.   guaiFENesin-dextromethorphan (ROBITUSSIN DM) 100-10 MG/5ML syrup Take 5 mLs by mouth every 4 (four) hours as needed for cough.     Allergies:   Patient has no known allergies.   Social History   Tobacco Use   Smoking status: Never   Smokeless tobacco: Never  Substance Use Topics   Alcohol use: No   Drug use: No     Family Hx: The patient's family history includes Anxiety disorder in his mother; Bipolar disorder in his maternal grandmother; Colitis in his mother; Depression in his paternal grandmother; Diabetes in his father and paternal grandfather; Gallstones in his paternal grandfather; Hypertension in his father; Lactose intolerance in his mother.  ROS:   Please see the history of present illness.    Review of Systems  Constitutional:  Negative for chills and malaise/fatigue.  HENT:  Positive for congestion and sinus pain. Negative for ear pain.  Nasal drainage  Respiratory:  Positive for cough. Negative for shortness of breath and wheezing.   Cardiovascular:  Negative for chest pain and palpitations.  Neurological:  Positive for headaches.   All other systems reviewed and are negative.   Labs/Other Tests and Data Reviewed:    Recent Labs: 12/10/2020: ALT 18; BUN 15; Creatinine, Ser 1.21; Hemoglobin 17.6; Platelets 245; Potassium 4.0; Sodium 143   Recent Lipid Panel No results found for: CHOL, TRIG, HDL, CHOLHDL, LDLCALC, LDLDIRECT  Wt Readings from Last 3  Encounters:  12/10/20 141 lb 9.6 oz (64.2 kg)  09/04/18 141 lb 12.8 oz (64.3 kg) (36 %, Z= -0.36)*  03/20/18 138 lb 6.4 oz (62.8 kg) (33 %, Z= -0.43)*   * Growth percentiles are based on CDC (Boys, 2-20 Years) data.     Exam:    Vital Signs:  Ht 5' 7.2" (1.707 m)   BMI 22.05 kg/m     Physical Exam Vitals and nursing note reviewed.  HENT:     Head: Normocephalic and atraumatic.  Pulmonary:     Effort: Pulmonary effort is normal.  Neurological:     Mental Status: He is alert and oriented to person, place, and time.  Psychiatric:        Mood and Affect: Affect normal.    ASSESSMENT & PLAN:    1. Sinus congestion -Tested negative for COVID  -had sinus congestion with cough.  - amoxicillin-clavulanate (AUGMENTIN) 875-125 MG tablet; Take 1 tablet by mouth 2 (two) times daily for 7 days.  Dispense: 14 tablet; Refill: 0  2. Cough - guaiFENesin-dextromethorphan (ROBITUSSIN DM) 100-10 MG/5ML syrup; Take 5 mLs by mouth every 4 (four) hours as needed for cough.  Dispense: 118 mL; Refill: 0 - benzonatate (TESSALON PERLES) 100 MG capsule; Take 1 capsule (100 mg total) by mouth 3 (three) times daily as needed for cough.  Dispense: 30 capsule; Refill: 1  The patient was encouraged to call or send a message through MyChart for any questions or concerns.   Follow up: if symptoms persist or do not get better.   Side effects and appropriate use of all the medication(s) were discussed with the patient today. Patient advised to use the medication(s) as directed by their healthcare provider. The patient was encouraged to read, review, and understand all associated package inserts and contact our office with any questions or concerns. The patient accepts the risks of the treatment plan and had an opportunity to ask questions.   Advised patient to take Vitamin C, D, Zinc.  Keep yourself hydrated with a lot of water and rest. Take Delsym for cough and Mucinex as need. Take Tylenol or pain reliever  every 4-6 hours as needed for pain/fever/body ache. If you have elevated blood pressure, you can take OTC Corcidin. You can also take OTC oscillococcinum to help with your symptoms.  Educated patient if symptoms get worse or if she experiences any SOB, chest pain or pain in her legs to seek immediate emergency care. Continue to monitor your oxygen levels. Call us if you have any questions. Quarantine for 5 days if tested positive and no symptoms or 10 days if tested positive and have symptoms. Wear a mask around other people.   Patient was given opportunity to ask questions. Patient verbalized understanding of the plan and was able to repeat key elements of the plan. All questions were answered to their satisfaction.  Raman Ghumman, DNP   I, Raman Ghumman have reviewed all documentation for this visit. The  documentation on 03/11/21 for the exam, diagnosis, procedures, and orders are all accurate and complete.   "I discussed the limitations of evaluation and management by telemedicine and the availability of in person appointments. The patient expressed understanding and agreed to proceed"      COVID-19 Education: The signs and symptoms of COVID-19 were discussed with the patient and how to seek care for testing (follow up with PCP or arrange E-visit).  The importance of social distancing was discussed today.  Patient Risk:   After full review of this patients clinical status, I feel that they are at least moderate risk at this time.  Time:   Today, I have spent 15 minutes/ seconds with the patient with telehealth technology discussing above diagnoses.     Medication Adjustments/Labs and Tests Ordered: Current medicines are reviewed at length with the patient today.  Concerns regarding medicines are outlined above.   Tests Ordered: No orders of the defined types were placed in this encounter.   Medication Changes: Meds ordered this encounter  Medications   amoxicillin-clavulanate  (AUGMENTIN) 875-125 MG tablet    Sig: Take 1 tablet by mouth 2 (two) times daily for 7 days.    Dispense:  14 tablet    Refill:  0   guaiFENesin-dextromethorphan (ROBITUSSIN DM) 100-10 MG/5ML syrup    Sig: Take 5 mLs by mouth every 4 (four) hours as needed for cough.    Dispense:  118 mL    Refill:  0   benzonatate (TESSALON PERLES) 100 MG capsule    Sig: Take 1 capsule (100 mg total) by mouth 3 (three) times daily as needed for cough.    Dispense:  30 capsule    Refill:  1     Disposition:  Follow up prn  Signed, Charlesetta Ivory, NP

## 2021-04-20 ENCOUNTER — Telehealth: Payer: No Typology Code available for payment source | Admitting: Physician Assistant

## 2021-04-20 ENCOUNTER — Other Ambulatory Visit (HOSPITAL_COMMUNITY): Payer: Self-pay

## 2021-04-20 DIAGNOSIS — R0981 Nasal congestion: Secondary | ICD-10-CM | POA: Diagnosis not present

## 2021-04-20 MED ORDER — BENZONATATE 100 MG PO CAPS
100.0000 mg | ORAL_CAPSULE | Freq: Three times a day (TID) | ORAL | 0 refills | Status: AC
  Filled 2021-04-20: qty 15, 5d supply, fill #0

## 2021-04-20 MED ORDER — FLUTICASONE PROPIONATE 50 MCG/ACT NA SUSP
2.0000 | Freq: Every day | NASAL | 0 refills | Status: DC
  Filled 2021-04-20: qty 16, 30d supply, fill #0

## 2021-04-20 NOTE — Progress Notes (Signed)
E-Visit for Sinus Problems  We are sorry that you are not feeling well.  Here is how we plan to help!    Based on what you have shared with me it looks like you could have sinusitis though your symptoms are very similar to covid and could be consistent with COVID. I recommend that you test yourself to rule this out.    Sinusitis is inflammation and infection in the sinus cavities of the head.  Based on your presentation I believe you most likely have Acute Viral Sinusitis.This is an infection most likely caused by a virus. There is not specific treatment for viral sinusitis other than to help you with the symptoms until the infection runs its course.  You may use an oral decongestant such as Mucinex D or if you have glaucoma or high blood pressure use plain Mucinex. Saline nasal spray help and can safely be used as often as needed for congestion, I have prescribed: Fluticasone nasal spray two sprays in each nostril once a day  I have also prescribed tessalon to help with your cough  Some authorities believe that zinc sprays or the use of Echinacea may shorten the course of your symptoms.  Sinus infections are not as easily transmitted as other respiratory infection, however we still recommend that you avoid close contact with loved ones, especially the very young and elderly.  Remember to wash your hands thoroughly throughout the day as this is the number one way to prevent the spread of infection!  Home Care: Only take medications as instructed by your medical team. Do not take these medications with alcohol. A steam or ultrasonic humidifier can help congestion.  You can place a towel over your head and breathe in the steam from hot water coming from a faucet. Avoid close contacts especially the very young and the elderly. Cover your mouth when you cough or sneeze. Always remember to wash your hands.  Get Help Right Away If: You develop worsening fever or sinus pain. You develop a severe  head ache or visual changes. Your symptoms persist after you have completed your treatment plan.  Make sure you Understand these instructions. Will watch your condition. Will get help right away if you are not doing well or get worse.   Thank you for choosing an e-visit.  Your e-visit answers were reviewed by a board certified advanced clinical practitioner to complete your personal care plan. Depending upon the condition, your plan could have included both over the counter or prescription medications.  Please review your pharmacy choice. Make sure the pharmacy is open so you can pick up prescription now. If there is a problem, you may contact your provider through [employer] and have the prescription routed to another pharmacy.  Your safety is important to [place]. If you have drug allergies check your prescription carefully.   For the next 24 hours you can use MyChart to ask questions about today's visit, request a non-urgent call back, or ask for a work or school excuse. You will get an email in the next two days asking about your experience. I hope that your e-visit has been valuable and will speed your recovery.  Approximately 5 minutes was spent documenting and reviewing patient's chart.

## 2021-04-22 ENCOUNTER — Ambulatory Visit (INDEPENDENT_AMBULATORY_CARE_PROVIDER_SITE_OTHER): Payer: No Typology Code available for payment source | Admitting: Nurse Practitioner

## 2021-04-22 ENCOUNTER — Other Ambulatory Visit: Payer: Self-pay

## 2021-04-22 ENCOUNTER — Other Ambulatory Visit (HOSPITAL_COMMUNITY): Payer: Self-pay

## 2021-04-22 ENCOUNTER — Encounter: Payer: Self-pay | Admitting: Nurse Practitioner

## 2021-04-22 DIAGNOSIS — J029 Acute pharyngitis, unspecified: Secondary | ICD-10-CM | POA: Diagnosis not present

## 2021-04-22 DIAGNOSIS — R0981 Nasal congestion: Secondary | ICD-10-CM

## 2021-04-22 LAB — POC COVID19 BINAXNOW: SARS Coronavirus 2 Ag: NEGATIVE

## 2021-04-22 LAB — POCT RAPID STREP A (OFFICE): Rapid Strep A Screen: NEGATIVE

## 2021-04-22 LAB — POC INFLUENZA A&B (BINAX/QUICKVUE)
Influenza A, POC: NEGATIVE
Influenza B, POC: NEGATIVE

## 2021-04-22 MED ORDER — AMOXICILLIN-POT CLAVULANATE 875-125 MG PO TABS
1.0000 | ORAL_TABLET | Freq: Two times a day (BID) | ORAL | 0 refills | Status: AC
  Filled 2021-04-22: qty 14, 7d supply, fill #0

## 2021-04-22 NOTE — Progress Notes (Signed)
I,Tianna Badgett,acting as a Neurosurgeon for [employer], NP.,have documented all relevant documentation on the behalf of [employer], NP,as directed by  Charlesetta Ivory, NP while in the presence of Charlesetta Ivory, NP.  This visit occurred during the SARS-CoV-2 public health emergency.  Safety protocols were in place, including screening questions prior to the visit, additional usage of staff PPE, and extensive cleaning of exam room while observing appropriate contact time as indicated for disinfecting solutions.  Subjective:     Patient ID: Jermaine Blackwell , male    DOB: 10/26/2000 , 20 y.o.   MRN: [id number]   Chief Complaint  Patient presents with   Sore Throat    HPI  Patient is here for sore throat. He states that his symptoms started Friday. Over time his symptoms have worsened. He has had a slight fever. He has some congestion, and bad headache. No shortness of breath or chest pain.   Sore Throat  Associated symptoms include congestion, coughing and headaches. Pertinent negatives include no diarrhea, ear pain or shortness of breath.    Past Medical History:  Diagnosis Date   ADD (attention deficit disorder with hyperactivity)    Anxiety    IBS (irritable bowel syndrome)    Seasonal allergies      Family History  Problem Relation Age of Onset   Anxiety disorder Mother    Colitis Mother    Lactose intolerance Mother    Diabetes Father    Hypertension Father    Bipolar disorder Maternal Grandmother    Depression Paternal Grandmother    Diabetes Paternal Grandfather    Gallstones Paternal Grandfather      Current Outpatient Medications:    amoxicillin-clavulanate (AUGMENTIN) 875-125 MG tablet, Take 1 tablet by mouth 2 (two) times daily for 7 days., Disp: 14 tablet, Rfl: 0   benzonatate (TESSALON PERLES) 100 MG capsule, Take 1 capsule (100 mg total) by mouth 3 (three) times daily as needed for cough., Disp: 30 capsule, Rfl: 1   benzonatate (TESSALON) 100 MG  capsule, Take 1 capsule (100 mg total) by mouth every 8 (eight) hours for 5 days., Disp: 15 capsule, Rfl: 0   fluticasone (FLONASE) 50 MCG/ACT nasal spray, Place 2 sprays into both nostrils daily., Disp: 16 g, Rfl: 0   guaiFENesin-dextromethorphan (ROBITUSSIN DM) 100-10 MG/5ML syrup, Take 5 mLs by mouth every 4 (four) hours as needed for cough., Disp: 118 mL, Rfl: 0   No Known Allergies   Review of Systems  Constitutional:  Positive for chills and fatigue.  HENT:  Positive for congestion, rhinorrhea, sneezing and sore throat. Negative for ear pain and postnasal drip.   Respiratory:  Positive for cough. Negative for shortness of breath and wheezing.   Cardiovascular: Negative.  Negative for chest pain and palpitations.  Gastrointestinal: Negative.  Negative for constipation, diarrhea and nausea.  Musculoskeletal:  Negative for arthralgias and myalgias.  Neurological:  Positive for headaches. Negative for seizures and weakness.    There were no vitals filed for this visit. There is no height or weight on file to calculate BMI.   Objective:  Physical Exam Constitutional:      Appearance: He is well-developed. He is ill-appearing.  HENT:     Head: Normocephalic and atraumatic.     Nose: Congestion present.  Eyes:     Conjunctiva/sclera: Conjunctivae normal.  Cardiovascular:     Rate and Rhythm: Normal rate and regular rhythm.     Heart sounds: Normal heart sounds. No murmur heard. Skin:  Capillary Refill: Capillary refill takes less than 2 seconds.  Neurological:     Mental Status: He is alert.        Assessment And Plan:     1. Sore throat - POC COVID-19 - POCT rapid strep A - POC Influenza A&B ([facility] test) - Novel Coronavirus, NAA ([facility]) - amoxicillin-clavulanate (AUGMENTIN) 875-125 MG tablet; Take 1 tablet by mouth 2 (two) times daily for 7 days.  Dispense: 14 tablet; Refill: 0  2. Sinus congestion - amoxicillin-clavulanate (AUGMENTIN) 875-125 MG tablet; Take 1  tablet by mouth 2 (two) times daily for 7 days.  Dispense: 14 tablet; Refill: 0    The patient was encouraged to call or send a message through [place] for any questions or concerns.   Follow up: if symptoms persist or do not get better.   Side effects and appropriate use of all the medication(s) were discussed with the patient today. Patient advised to use the medication(s) as directed by their healthcare provider. The patient was encouraged to read, review, and understand all associated package inserts and contact our office with any questions or concerns. The patient accepts the risks of the treatment plan and had an opportunity to ask questions.   Staying healthy and adopting a healthy lifestyle for your overall health is important. You should eat 7 or more servings of fruits and vegetables per day. You should drink plenty of water to keep yourself hydrated and your kidneys healthy. This includes about 65-80+ fluid ounces of water. Limit your intake of animal fats especially for elevated cholesterol. Avoid highly processed food and limit your salt intake if you have hypertension. Avoid foods high in saturated/Trans fats. Along with a healthy diet it is also very important to maintain time for yourself to maintain a healthy mental health with low stress levels. You should get atleast 150 min of moderate intensity exercise weekly for a healthy heart. Along with eating right and exercising, aim for at least 7-9 hours of sleep daily.  Eat more whole grains which includes barley, wheat berries, oats, brown rice and whole wheat pasta. Use healthy plant oils which include olive, soy, corn, sunflower and peanut. Limit your caffeine and sugary drinks. Limit your intake of fast foods. Limit milk and dairy products to one or two daily servings.   Advised patient to take Vitamin C, D, Zinc.  Keep yourself hydrated with a lot of water and rest. Take Delsym for cough and Mucinex as need. Take Tylenol or pain  reliever every 4-6 hours as needed for pain/fever/body ache. If you have elevated blood pressure, you can take OTC Corcidin. You can also take OTC oscillococcinum to help with your symptoms.  Educated patient if symptoms get worse or if she experiences any SOB, chest pain or pain in her legs to seek immediate emergency care. Continue to monitor your oxygen levels. Call us if you have any questions.  Patient was given opportunity to ask questions. Patient verbalized understanding of the plan and was able to repeat key elements of the plan. All questions were answered to their satisfaction.  Raman Mikell Camp, DNP   I, Raman Manasi Dishon have reviewed all documentation for this visit. The documentation on 04/22/21 for the exam, diagnosis, procedures, and orders are all accurate and complete.    IF YOU HAVE BEEN REFERRED TO A SPECIALIST, IT MAY TAKE 1-2 WEEKS TO SCHEDULE/PROCESS THE REFERRAL. IF YOU HAVE NOT HEARD FROM US/SPECIALIST IN TWO WEEKS, PLEASE GIVE [place] A CALL AT [phone].  THE PATIENT IS ENCOURAGED TO PRACTICE SOCIAL DISTANCING DUE TO THE COVID-19 PANDEMIC.

## 2021-04-22 NOTE — Patient Instructions (Signed)
Sore Throat ?A sore throat is pain, burning, irritation, or scratchiness in the throat. When you have a sore throat, you may feel pain or tenderness in your throat when you swallow or talk. ?Many things can cause a sore throat, including: ?An infection. ?Seasonal allergies. ?Dryness in the air. ?Irritants, such as smoke or pollution. ?Radiation treatment for cancer. ?Gastroesophageal reflux disease (GERD). ?A tumor. ?A sore throat is often the first sign of another sickness. It may happen with other symptoms, such as coughing, sneezing, fever, and swollen neck glands. Most sore throats go away without medical treatment. ?Follow these instructions at home: ?  ?Medicines ?Take over-the-counter and prescription medicines only as told by your health care provider. ?Children often get sore throats. Do not give your child aspirin because of the association with Reye's syndrome. ?Use throat sprays to soothe your throat as told by your health care provider. ?Managing pain ?To help with pain, try: ?Sipping warm liquids, such as broth, herbal tea, or warm water. ?Eating or drinking cold or frozen liquids, such as frozen ice pops. ?Gargling with a mixture of salt and water 3-4 times a day or as needed. To make salt water, completely dissolve ?-1 tsp (3-6 g) of salt in 1 cup (237 mL) of warm water. ?Sucking on hard candy or throat lozenges. ?Putting a cool-mist humidifier in your bedroom at night to moisten the air. ?Sitting in the bathroom with the door closed for 5-10 minutes while you run hot water in the shower. ?General instructions ?Do not use any products that contain nicotine or tobacco. These products include cigarettes, chewing tobacco, and vaping devices, such as e-cigarettes. If you need help quitting, ask your health care provider. ?Rest as needed. ?Drink enough fluid to keep your urine pale yellow. ?Wash your hands often with soap and water for at least 20 seconds. If soap and water are not available, use hand  sanitizer. ?Contact a health care provider if: ?You have a fever for more than 2-3 days. ?You have symptoms that last for more than 2-3 days. ?Your throat does not get better within 7 days. ?You have a fever and your symptoms suddenly get worse. ?Get help right away if: ?You have difficulty breathing. ?You cannot swallow fluids, soft foods, or your saliva. ?You have increased swelling in your throat or neck. ?You have persistent nausea and vomiting. ?These symptoms may represent a serious problem that is an emergency. Do not wait to see if the symptoms will go away. Get medical help right away. Call your local emergency services (911 in the [place] Do not drive yourself to the hospital. ?Summary ?A sore throat is pain, burning, irritation, or scratchiness in the throat. Many things can cause a sore throat. ?Take over-the-counter medicines only as told by your health care provider. ?Rest as needed. ?Drink enough fluid to keep your urine pale yellow. ?Contact a health care provider if your throat does not get better within 7 days. ?This information is not intended to replace advice given to you by your health care provider. Make sure you discuss any questions you have with your health care provider. ?Document Revised: 09/03/2020 Document Reviewed: 09/03/2020 ?Elsevier Patient Education ? 2022 Elsevier Inc. ? ?

## 2021-04-23 LAB — SARS-COV-2, NAA 2 DAY TAT

## 2021-04-23 LAB — NOVEL CORONAVIRUS, NAA: SARS-CoV-2, NAA: NOT DETECTED

## 2021-04-24 ENCOUNTER — Telehealth: Payer: Self-pay

## 2021-05-11 ENCOUNTER — Encounter: Payer: Self-pay | Admitting: Nurse Practitioner

## 2021-05-11 ENCOUNTER — Ambulatory Visit
Admission: RE | Admit: 2021-05-11 | Discharge: 2021-05-11 | Disposition: A | Discharge status: Home / Self Care | Payer: No Typology Code available for payment source | Source: Ambulatory Visit | Attending: Nurse Practitioner | Admitting: Nurse Practitioner

## 2021-05-11 ENCOUNTER — Other Ambulatory Visit (HOSPITAL_COMMUNITY): Payer: Self-pay

## 2021-05-11 ENCOUNTER — Ambulatory Visit (INDEPENDENT_AMBULATORY_CARE_PROVIDER_SITE_OTHER): Payer: No Typology Code available for payment source | Admitting: Nurse Practitioner

## 2021-05-11 ENCOUNTER — Other Ambulatory Visit: Payer: Self-pay

## 2021-05-11 VITALS — BP 98/64 | HR 75 | Temp 98.0°F | Ht 67.2 in | Wt 135.0 lb

## 2021-05-11 DIAGNOSIS — R059 Cough, unspecified: Secondary | ICD-10-CM

## 2021-05-11 MED ORDER — PREDNISONE 10 MG (21) PO TBPK
ORAL_TABLET | ORAL | 0 refills | Status: DC
  Filled 2021-05-11: qty 21, 6d supply, fill #0

## 2021-05-11 NOTE — Progress Notes (Signed)
I,Yamilka J Llittleton,acting as a Education administrator for [employer], FNP.,have documented all relevant documentation on the behalf of Minette Brine, FNP,as directed by  Minette Brine, FNP while in the presence of Minette Brine, [place].   This visit occurred during the SARS-CoV-2 public health emergency.  Safety protocols were in place, including screening questions prior to the visit, additional usage of staff PPE, and extensive cleaning of exam room while observing appropriate contact time as indicated for disinfecting solutions.  Subjective:     Patient ID: Jermaine Blackwell , male    DOB: 2000-07-13 , 20 y.o.   MRN: [id number]   Chief Complaint  Patient presents with   Cough    HPI  Patient was seen outside due to symptoms  Wt Readings from Last 3 Encounters: 05/11/21 : 135 lb (61.2 kg) 12/10/20 : 141 lb 9.6 oz (64.2 kg)   Cough This is a recurrent problem. The current episode started more than 1 month ago. The problem has been unchanged. The cough is Productive of sputum. Associated symptoms include nasal congestion and rhinorrhea. Pertinent negatives include no chills, fever, headaches or sore throat. Nothing aggravates the symptoms. He has tried prescription cough suppressant for the symptoms. The treatment provided mild relief.    Past Medical History:  Diagnosis Date   ADD (attention deficit disorder with hyperactivity)    Anxiety    IBS (irritable bowel syndrome)    Seasonal allergies      Family History  Problem Relation Age of Onset   Anxiety disorder Mother    Colitis Mother    Lactose intolerance Mother    Diabetes Father    Hypertension Father    Bipolar disorder Maternal Grandmother    Depression Paternal Grandmother    Diabetes Paternal Grandfather    Gallstones Paternal Grandfather      Current Outpatient Medications:    benzonatate (TESSALON PERLES) 100 MG capsule, Take 1 capsule (100 mg total) by mouth 3 (three) times daily as needed for cough. (Patient not taking:  Reported on 05/11/2021), Disp: 30 capsule, Rfl: 1   fluticasone (FLONASE) 50 MCG/ACT nasal spray, Place 2 sprays into both nostrils daily. (Patient not taking: Reported on 05/11/2021), Disp: 16 g, Rfl: 0   guaiFENesin-dextromethorphan (ROBITUSSIN DM) 100-10 MG/5ML syrup, Take 5 mLs by mouth every 4 (four) hours as needed for cough. (Patient not taking: Reported on 05/11/2021), Disp: 118 mL, Rfl: 0   No Known Allergies   Review of Systems  Constitutional:  Negative for chills and fever.  HENT:  Positive for rhinorrhea. Negative for sore throat.   Respiratory:  Positive for cough.   Neurological:  Negative for dizziness and headaches.  Psychiatric/Behavioral: Negative.      Today's Vitals   05/11/21 1440  BP: 98/64  Pulse: 75  Temp: 98 F (36.7 C)  Weight: 135 lb (61.2 kg)  Height: 5' 7.2" (1.707 m)  PainSc: 0-No pain   Body mass index is 21.02 kg/m.   Objective:  Physical Exam Vitals reviewed.  Constitutional:      General: He is not in acute distress.    Appearance: Normal appearance.  Cardiovascular:     Rate and Rhythm: Normal rate and regular rhythm.     Pulses: Normal pulses.     Heart sounds: Normal heart sounds. No murmur heard. Pulmonary:     Effort: Pulmonary effort is normal. No respiratory distress.     Breath sounds: Normal breath sounds. No wheezing.  Neurological:     General: No focal  deficit present.     Mental Status: He is alert and oriented to person, place, and time.     Cranial Nerves: No cranial nerve deficit.     Motor: No weakness.  Psychiatric:        Mood and Affect: Mood normal.        Behavior: Behavior normal.        Thought Content: Thought content normal.        Judgment: Judgment normal.        Assessment And Plan:     1. Cough, unspecified type  Cough persists since being treated Nov 2, will treat with prednisone as has likely developed to bronchitis. He also mentioned being tested for mono due to being on a college campus,  offered but declined since would be a blood draw.  He is to also get a CXR due to persistent cough   Patient was given opportunity to ask questions. Patient verbalized understanding of the plan and was able to repeat key elements of the plan. All questions were answered to their satisfaction.  Arnette Felts, FNP   I, Arnette Felts, FNP, have reviewed all documentation for this visit. The documentation on 05/11/21 for the exam, diagnosis, procedures, and orders are all accurate and complete.   IF YOU HAVE BEEN REFERRED TO A SPECIALIST, IT MAY TAKE 1-2 WEEKS TO SCHEDULE/PROCESS THE REFERRAL. IF YOU HAVE NOT HEARD FROM US/SPECIALIST IN TWO WEEKS, PLEASE GIVE [place] A CALL AT [phone].   THE PATIENT IS ENCOURAGED TO PRACTICE SOCIAL DISTANCING DUE TO THE COVID-19 PANDEMIC.

## 2021-07-21 ENCOUNTER — Other Ambulatory Visit (HOSPITAL_COMMUNITY): Payer: Self-pay

## 2021-07-21 ENCOUNTER — Encounter (HOSPITAL_COMMUNITY): Payer: Self-pay | Admitting: Emergency Medicine

## 2021-07-21 ENCOUNTER — Other Ambulatory Visit: Payer: Self-pay

## 2021-07-21 ENCOUNTER — Ambulatory Visit (HOSPITAL_COMMUNITY)
Admission: EM | Admit: 2021-07-21 | Discharge: 2021-07-21 | Disposition: A | Discharge status: Home / Self Care | Payer: No Typology Code available for payment source | Attending: Urgent Care | Admitting: Urgent Care

## 2021-07-21 DIAGNOSIS — R141 Gas pain: Secondary | ICD-10-CM | POA: Diagnosis not present

## 2021-07-21 DIAGNOSIS — R143 Flatulence: Secondary | ICD-10-CM | POA: Diagnosis not present

## 2021-07-21 DIAGNOSIS — R1084 Generalized abdominal pain: Secondary | ICD-10-CM | POA: Diagnosis not present

## 2021-07-21 DIAGNOSIS — R142 Eructation: Secondary | ICD-10-CM | POA: Diagnosis not present

## 2021-07-21 MED ORDER — LIDOCAINE VISCOUS HCL 2 % MT SOLN
15.0000 mL | Freq: Once | OROMUCOSAL | Status: AC
  Administered 2021-07-21: 15 mL via ORAL

## 2021-07-21 MED ORDER — ALUM & MAG HYDROXIDE-SIMETH 400-400-40 MG/5ML PO SUSP
5.0000 mL | Freq: Four times a day (QID) | ORAL | 0 refills | Status: DC | PRN
Start: 2021-07-21 — End: 2021-12-23
  Filled 2021-07-21: qty 100, 5d supply, fill #0

## 2021-07-21 MED ORDER — ALUM & MAG HYDROXIDE-SIMETH 200-200-20 MG/5ML PO SUSP
30.0000 mL | Freq: Once | ORAL | Status: AC
  Administered 2021-07-21: 30 mL via ORAL

## 2021-07-21 MED ORDER — ALUM & MAG HYDROXIDE-SIMETH 200-200-20 MG/5ML PO SUSP
ORAL | Status: AC
  Filled 2021-07-21: qty 30

## 2021-07-21 MED ORDER — LIDOCAINE VISCOUS HCL 2 % MT SOLN
OROMUCOSAL | Status: AC
  Filled 2021-07-21: qty 15

## 2021-07-21 NOTE — ED Provider Notes (Signed)
[facility]    CSN: [id number] Arrival date & time: 07/21/21  [id number]      History   Chief Complaint Chief Complaint  Patient presents with   Abdominal Pain    HPI Jermaine Blackwell is a 21 y.o. male.   Pleasant 21 year old male presents today with concerns of abdominal discomfort.  He states the discomfort feels like a gassy bloating.  He states it is crampy in nature.  He states it started last evening, and he woke up with it this morning as well.  He admits that it has decreased in severity.  He denies nausea, vomiting, diarrhea.  He denies any fever.  He denies any upper respiratory symptoms.  He does have a history of IBS.  He denies any change to his diet recently.  He has not yet eaten breakfast this morning, but was able to tolerate chicken tenders last evening.  He does have a history of using Anaspaz, but has not taken it for "a while".   Abdominal Pain  Past Medical History:  Diagnosis Date   ADD (attention deficit disorder with hyperactivity)    Anxiety    IBS (irritable bowel syndrome)    Seasonal allergies     Patient Active Problem List   Diagnosis Date Noted   Generalized anxiety disorder 04/20/2018   Abdominal pain 03/20/2018    History reviewed. No pertinent surgical history.     Home Medications    Prior to Admission medications   Medication Sig Start Date End Date Taking? Authorizing Provider  alum & mag hydroxide-simeth (MAALOX PLUS) 400-400-40 MG/5ML suspension Take 5 mLs by mouth every 6 (six) hours as needed for indigestion (gas pains). 07/21/21  Yes Alyssamae Klinck L, PA  ANASPAZ 0.125 MG TBDP disintergrating tablet TAKE 1 TABLET BY MOUTH EVERY 4 HOURS AS NEEDED FOR ABDOMINAL CRAMPING CAN REPEAT ONCE IN 30 MINUTES IFNO RELIEF 03/02/18 02/08/20  [provider]    Family History Family History  Problem Relation Age of Onset   Anxiety disorder Mother    Colitis Mother    Lactose intolerance Mother    Diabetes Father     Hypertension Father    Bipolar disorder Maternal Grandmother    Depression Paternal Grandmother    Diabetes Paternal Grandfather    Gallstones Paternal Grandfather     Social History Social History   Tobacco Use   Smoking status: Never   Smokeless tobacco: Never  Substance Use Topics   Alcohol use: No   Drug use: No     Allergies   Patient has no known allergies.   Review of Systems Review of Systems  Gastrointestinal:  Positive for abdominal pain.  All other systems reviewed and are negative.   Physical Exam Triage Vital Signs ED Triage Vitals [07/21/21 1127]  Enc Vitals Group     BP 122/72     Pulse Rate 63     Resp 16     Temp 98.1 F (36.7 C)     Temp Source Oral     SpO2 98 %     Weight      Height      Head Circumference      Peak Flow      Pain Score 7     Pain Loc      Pain Edu?      Excl. in [place]?    No data found.  Updated Vital Signs BP 122/72 (BP Location: Left Arm)    Pulse 63  Temp 98.1 F (36.7 C) (Oral)    Resp 16    SpO2 98%   Visual Acuity Right Eye Distance:   Left Eye Distance:   Bilateral Distance:    Right Eye Near:   Left Eye Near:    Bilateral Near:     Physical Exam Vitals and nursing note reviewed.  Constitutional:      General: He is not in acute distress.    Appearance: He is well-developed and normal weight. He is not ill-appearing, toxic-appearing or diaphoretic.  HENT:     Head: Normocephalic and atraumatic.     Mouth/Throat:     Mouth: Mucous membranes are moist.     Pharynx: Oropharynx is clear. No pharyngeal swelling or oropharyngeal exudate.  Eyes:     General: No scleral icterus.    Extraocular Movements: Extraocular movements intact.     Pupils: Pupils are equal, round, and reactive to light.  Cardiovascular:     Rate and Rhythm: Normal rate.     Heart sounds: Normal heart sounds.    No friction rub. No gallop.  Pulmonary:     Effort: Pulmonary effort is normal. No respiratory distress.     Breath  sounds: Normal breath sounds. No stridor. No wheezing, rhonchi or rales.  Chest:     Chest wall: No tenderness.  Abdominal:     General: Abdomen is flat. Bowel sounds are normal. There is no distension or abdominal bruit. There are no signs of injury.     Palpations: Abdomen is soft. There is no shifting dullness, fluid wave, hepatomegaly, splenomegaly, mass or pulsatile mass.     Tenderness: There is no abdominal tenderness. There is no right CVA tenderness, left CVA tenderness, guarding or rebound. Negative signs include McBurney's sign.     Hernia: No hernia is present.  Skin:    General: Skin is warm.     Capillary Refill: Capillary refill takes less than 2 seconds.     Coloration: Skin is not jaundiced or pale.     Findings: No rash.  Neurological:     General: No focal deficit present.     Mental Status: He is alert and oriented to person, place, and time.     Cranial Nerves: No cranial nerve deficit.     Motor: No weakness.  Psychiatric:        Mood and Affect: Mood normal.     UC Treatments / Results  Labs (all labs ordered are listed, but only abnormal results are displayed) Labs Reviewed - No data to display  EKG   Radiology No results found.  Procedures Procedures (including critical care time)  Medications Ordered in UC Medications  alum & mag hydroxide-simeth (MAALOX/MYLANTA) 200-200-20 MG/5ML suspension 30 mL (30 mLs Oral Given 07/21/21 1204)    And  lidocaine (XYLOCAINE) 2 % viscous mouth solution 15 mL (15 mLs Oral Given 07/21/21 1204)    Initial Impression / Assessment and Plan / UC Course  I have reviewed the triage vital signs and the nursing notes.  Pertinent labs & imaging results that were available during my care of the patient were reviewed by me and considered in my medical decision making (see chart for details).     Abdominal pain -GI cocktail given in office, with near resolution to symptoms.  Patient home with a prescription for Maalox.   Patient does have a history of IBS, this is also a differential.  Would recommend patient follow-up with his PCP for possibly restarting his  Anaspaz. Flatulence and gas pain -patient had good improvement with simethicone in office.  No indication for further work-up at present time.  Supportive measures  Final Clinical Impressions(s) / UC Diagnoses   Final diagnoses:  Generalized abdominal pain  Flatulence, eructation and gas pain     Discharge Instructions      You were given a GI cocktail in our office. You can continue the medications prescribed as needed for abdominal discomfort, or gas. To your symptoms, if worsening, change in bowel movements, or fever occurs return to clinic for recheck.    ED Prescriptions     Medication Sig Dispense Auth. Provider   alum & mag hydroxide-simeth (MAALOX PLUS) 400-400-40 MG/5ML suspension Take 5 mLs by mouth every 6 (six) hours as needed for indigestion (gas pains). 100 mL Jyrah Blye L, PA      PDMP not reviewed this encounter.   Chaney Malling, [place] 07/21/21 2205

## 2021-07-21 NOTE — Discharge Instructions (Signed)
You were given a GI cocktail in our office. You can continue the medications prescribed as needed for abdominal discomfort, or gas. To your symptoms, if worsening, change in bowel movements, or fever occurs return to clinic for recheck.

## 2021-07-21 NOTE — ED Triage Notes (Signed)
Pt reports abd pain last night and woke up with it. Reports severity has gone done. Denies n/v, urinary or bowel problems.

## 2021-09-08 ENCOUNTER — Encounter: Payer: Self-pay | Admitting: Nurse Practitioner

## 2021-12-23 ENCOUNTER — Encounter: Payer: Self-pay | Admitting: Nurse Practitioner

## 2021-12-23 ENCOUNTER — Other Ambulatory Visit (HOSPITAL_COMMUNITY)
Admission: RE | Admit: 2021-12-23 | Discharge: 2021-12-23 | Disposition: A | Discharge status: Home / Self Care | Payer: No Typology Code available for payment source | Source: Ambulatory Visit | Attending: Nurse Practitioner | Admitting: Nurse Practitioner

## 2021-12-23 ENCOUNTER — Ambulatory Visit (INDEPENDENT_AMBULATORY_CARE_PROVIDER_SITE_OTHER): Payer: No Typology Code available for payment source | Admitting: Nurse Practitioner

## 2021-12-23 VITALS — BP 120/60 | HR 97 | Temp 98.2°F | Ht 67.0 in | Wt 149.0 lb

## 2021-12-23 DIAGNOSIS — Z1159 Encounter for screening for other viral diseases: Secondary | ICD-10-CM | POA: Diagnosis not present

## 2021-12-23 DIAGNOSIS — Z113 Encounter for screening for infections with a predominantly sexual mode of transmission: Secondary | ICD-10-CM

## 2021-12-23 DIAGNOSIS — Z0000 Encounter for general adult medical examination without abnormal findings: Secondary | ICD-10-CM

## 2021-12-23 DIAGNOSIS — Z23 Encounter for immunization: Secondary | ICD-10-CM

## 2021-12-23 DIAGNOSIS — B079 Viral wart, unspecified: Secondary | ICD-10-CM | POA: Diagnosis not present

## 2021-12-23 DIAGNOSIS — Z114 Encounter for screening for human immunodeficiency virus [HIV]: Secondary | ICD-10-CM | POA: Diagnosis not present

## 2021-12-23 MED ORDER — HPV 9-VALENT RECOMB VACCINE IM SUSP: 0.5000 mL | Freq: Once | INTRAMUSCULAR | 0 refills | Status: AC

## 2021-12-23 NOTE — Progress Notes (Signed)
Hershal Coria Koors,acting as a Neurosurgeon for Arnette Felts, FNP.,have documented all relevant documentation on the behalf of Arnette Felts, FNP,as directed by  Arnette Felts, FNP while in the presence of Arnette Felts, FNP.   Subjective:     Patient ID: Jermaine Blackwell , male    DOB: 09/20/2000 , 21 y.o.   MRN: [id number]   Chief Complaint  Patient presents with   Annual Exam    HPI  Patient presents today for a annual exam. Patient has no other issues.      Past Medical History:  Diagnosis Date   ADD (attention deficit disorder with hyperactivity)    Anxiety    IBS (irritable bowel syndrome)    Seasonal allergies      Family History  Problem Relation Age of Onset   Anxiety disorder Mother    Colitis Mother    Lactose intolerance Mother    Diabetes Father    Hypertension Father    Bipolar disorder Maternal Grandmother    Depression Paternal Grandmother    Diabetes Paternal Grandfather    Gallstones Paternal Grandfather     No current outpatient medications on file.   No Known Allergies   Men's preventive visit. Patient Health Questionnaire (PHQ-2) is  Flowsheet Row Office Visit from 12/23/2021 in [facility]  PHQ-2 Total Score 0      Patient tries to follow a Pescatarian diet but will often eat regular diet.. Admits to not exercising regularly. Marital status: Single. Relevant history for alcohol use is:  Social History   Substance and Sexual Activity  Alcohol Use No   Relevant history for tobacco use is:  Social History   Tobacco Use  Smoking Status Never  Smokeless Tobacco Never  .   Review of Systems  Constitutional: Negative.   HENT: Negative.    Eyes: Negative.   Respiratory: Negative.    Cardiovascular: Negative.   Gastrointestinal: Negative.   Endocrine: Negative.   Genitourinary: Negative.   Musculoskeletal: Negative.   Skin: Negative.        Reports having warts to his hands and legs, he has treated with over the counter  medications but no changes.   Allergic/Immunologic: Negative.   Neurological: Negative.   Hematological: Negative.   Psychiatric/Behavioral: Negative.       Today's Vitals   12/23/21 0853  BP: 120/60  Pulse: 97  Temp: 98.2 F (36.8 C)  TempSrc: Oral  Weight: 149 lb (67.6 kg)  Height: 5\' 7"  (1.702 m)  PainSc: 0-No pain   Body mass index is 23.34 kg/m.  Wt Readings from Last 3 Encounters:  12/23/21 149 lb (67.6 kg)  05/11/21 135 lb (61.2 kg)  12/10/20 141 lb 9.6 oz (64.2 kg)     Objective:  Physical Exam Vitals reviewed.  Constitutional:      General: He is not in acute distress.    Appearance: Normal appearance.  HENT:     Head: Normocephalic and atraumatic.     Right Ear: Tympanic membrane, ear canal and external ear normal. There is no impacted cerumen.     Left Ear: Tympanic membrane, ear canal and external ear normal. There is no impacted cerumen.     Nose: Nose normal.     Mouth/Throat:     Mouth: Mucous membranes are moist.  Cardiovascular:     Rate and Rhythm: Normal rate and regular rhythm.     Pulses: Normal pulses.     Heart sounds: Normal heart sounds. No murmur heard.  Pulmonary:     Effort: Pulmonary effort is normal. No respiratory distress.     Breath sounds: Normal breath sounds. No wheezing.  Abdominal:     General: Abdomen is flat. Bowel sounds are normal. There is no distension.     Palpations: Abdomen is soft.  Musculoskeletal:        General: No swelling or tenderness. Normal range of motion.     Cervical back: Normal range of motion and neck supple.  Skin:    General: Skin is warm and dry.     Capillary Refill: Capillary refill takes less than 2 seconds.     Comments: Scattered warts to right leg, right posterior foot/ankle, and right hand on 2 metatarsal  Neurological:     General: No focal deficit present.     Mental Status: He is alert and oriented to person, place, and time.     Cranial Nerves: No cranial nerve deficit.     Motor: No  weakness.  Psychiatric:        Mood and Affect: Mood normal.        Behavior: Behavior normal.        Thought Content: Thought content normal.        Judgment: Judgment normal.         Assessment And Plan:    1. Annual physical exam Behavior modifications discussed and diet history reviewed.   Pt will continue to exercise regularly and modify diet with low GI, plant based foods and decrease intake of processed foods.  Recommend intake of daily multivitamin, Vitamin D, and calcium.  Recommend mammogram and colonoscopy for preventive screenings, as well as recommend immunizations that include influenza, TDAP, and Shingles  2. Encounter for immunization Rx sent to pharmacy - hpv 9-valent vaccine (GARDASIL 9) SUSP injection; Inject 0.5 mLs into the muscle once for 1 dose.  Dispense: 0.5 mL; Refill: 0  3. Screening for STD (sexually transmitted disease) - Hepatitis B surface antigen - HSV(herpes simplex vrs) 1+2 ab-IgG - RPR - Urine cytology ancillary only  4. Encounter for HIV (human immunodeficiency virus) test - HIV Antibody (routine testing w rflx)  5. Encounter for hepatitis C screening test for low risk patient Will check Hepatitis C screening due to recent recommendations to screen all adults 18 years and older - Hepatitis C antibody  6. Viral warts, unspecified type Comments: Would like referral to dermatologist due to reoccurring warts to hands and legs.  - Ambulatory referral to Dermatology     Patient was given opportunity to ask questions. Patient verbalized understanding of the plan and was able to repeat key elements of the plan. All questions were answered to their satisfaction.   Arnette Felts, FNP   I, Arnette Felts, FNP, have reviewed all documentation for this visit. The documentation on 12/23/21 for the exam, diagnosis, procedures, and orders are all accurate and complete.   THE PATIENT IS ENCOURAGED TO PRACTICE SOCIAL DISTANCING DUE TO THE COVID-19  PANDEMIC.

## 2021-12-23 NOTE — Patient Instructions (Signed)
Health Maintenance, Male Adopting a healthy lifestyle and getting preventive care are important in promoting health and wellness. Ask your health care provider about: The right schedule for you to have regular tests and exams. Things you can do on your own to prevent diseases and keep yourself healthy. What should I know about diet, weight, and exercise? Eat a healthy diet  Eat a diet that includes plenty of vegetables, fruits, low-fat dairy products, and lean protein. Do not eat a lot of foods that are high in solid fats, added sugars, or sodium. Maintain a healthy weight Body mass index (BMI) is a measurement that can be used to identify possible weight problems. It estimates body fat based on height and weight. Your health care provider can help determine your BMI and help you achieve or maintain a healthy weight. Get regular exercise Get regular exercise. This is one of the most important things you can do for your health. Most adults should: Exercise for at least 150 minutes each week. The exercise should increase your heart rate and make you sweat (moderate-intensity exercise). Do strengthening exercises at least twice a week. This is in addition to the moderate-intensity exercise. Spend less time sitting. Even light physical activity can be beneficial. Watch cholesterol and blood lipids Have your blood tested for lipids and cholesterol at 22 years of age, then have this test every 5 years. You may need to have your cholesterol levels checked more often if: Your lipid or cholesterol levels are high. You are older than 22 years of age. You are at high risk for heart disease. What should I know about cancer screening? Many types of cancers can be detected early and may often be prevented. Depending on your health history and family history, you may need to have cancer screening at various ages. This may include screening for: Colorectal cancer. Prostate cancer. Skin cancer. Lung  cancer. What should I know about heart disease, diabetes, and high blood pressure? Blood pressure and heart disease High blood pressure causes heart disease and increases the risk of stroke. This is more likely to develop in people who have high blood pressure readings or are overweight. Talk with your health care provider about your target blood pressure readings. Have your blood pressure checked: Every 3-5 years if you are 18-39 years of age. Every year if you are 40 years old or older. If you are between the ages of 65 and 75 and are a current or former smoker, ask your health care provider if you should have a one-time screening for abdominal aortic aneurysm (AAA). Diabetes Have regular diabetes screenings. This checks your fasting blood sugar level. Have the screening done: Once every three years after age 45 if you are at a normal weight and have a low risk for diabetes. More often and at a younger age if you are overweight or have a high risk for diabetes. What should I know about preventing infection? Hepatitis B If you have a higher risk for hepatitis B, you should be screened for this virus. Talk with your health care provider to find out if you are at risk for hepatitis B infection. Hepatitis C Blood testing is recommended for: Everyone born from 1945 through 1965. Anyone with known risk factors for hepatitis C. Sexually transmitted infections (STIs) You should be screened each year for STIs, including gonorrhea and chlamydia, if: You are sexually active and are younger than 22 years of age. You are older than 22 years of age and your   health care provider tells you that you are at risk for this type of infection. Your sexual activity has changed since you were last screened, and you are at increased risk for chlamydia or gonorrhea. Ask your health care provider if you are at risk. Ask your health care provider about whether you are at high risk for HIV. Your health care provider  may recommend a prescription medicine to help prevent HIV infection. If you choose to take medicine to prevent HIV, you should first get tested for HIV. You should then be tested every 3 months for as long as you are taking the medicine. Follow these instructions at home: Alcohol use Do not drink alcohol if your health care provider tells you not to drink. If you drink alcohol: Limit how much you have to 0-2 drinks a day. Know how much alcohol is in your drink. In the [place] one drink equals one 12 oz bottle of beer (355 mL), one 5 oz glass of wine (148 mL), or one 1 oz glass of hard liquor (44 mL). Lifestyle Do not use any products that contain nicotine or tobacco. These products include cigarettes, chewing tobacco, and vaping devices, such as e-cigarettes. If you need help quitting, ask your health care provider. Do not use street drugs. Do not share needles. Ask your health care provider for help if you need support or information about quitting drugs. General instructions Schedule regular health, dental, and eye exams. Stay current with your vaccines. Tell your health care provider if: You often feel depressed. You have ever been abused or do not feel safe at home. Summary Adopting a healthy lifestyle and getting preventive care are important in promoting health and wellness. Follow your health care provider's instructions about healthy diet, exercising, and getting tested or screened for diseases. Follow your health care provider's instructions on monitoring your cholesterol and blood pressure. This information is not intended to replace advice given to you by your health care provider. Make sure you discuss any questions you have with your health care provider. Document Revised: 10/27/2020 Document Reviewed: 10/27/2020 Elsevier Patient Education  2023 Elsevier Inc.  

## 2021-12-24 LAB — HSV(HERPES SIMPLEX VRS) I + II AB-IGG
HSV 1 Glycoprotein G Ab, IgG: <0.91 index (ref 0.00–0.90)
HSV 2 IgG, Type Spec: <0.91 index (ref 0.00–0.90)

## 2021-12-24 LAB — URINE CYTOLOGY ANCILLARY ONLY
Chlamydia: NEGATIVE
Comment: NEGATIVE
Comment: NEGATIVE
Comment: NORMAL
Neisseria Gonorrhea: NEGATIVE
Trichomonas: NEGATIVE

## 2021-12-24 LAB — HEPATITIS C ANTIBODY: Hep C Virus Ab: NONREACTIVE

## 2021-12-24 LAB — HIV ANTIBODY (ROUTINE TESTING W REFLEX): HIV Screen 4th Generation wRfx: NONREACTIVE

## 2021-12-24 LAB — HEPATITIS B SURFACE ANTIGEN: Hepatitis B Surface Ag: NEGATIVE

## 2021-12-24 LAB — RPR: RPR Ser Ql: NONREACTIVE

## 2022-03-24 ENCOUNTER — Other Ambulatory Visit (HOSPITAL_COMMUNITY): Payer: Self-pay

## 2022-03-24 ENCOUNTER — Telehealth: Payer: No Typology Code available for payment source | Admitting: Physician Assistant

## 2022-03-24 DIAGNOSIS — B9689 Other specified bacterial agents as the cause of diseases classified elsewhere: Secondary | ICD-10-CM | POA: Diagnosis not present

## 2022-03-24 DIAGNOSIS — J019 Acute sinusitis, unspecified: Secondary | ICD-10-CM | POA: Diagnosis not present

## 2022-03-24 MED ORDER — AMOXICILLIN-POT CLAVULANATE 875-125 MG PO TABS
1.0000 | ORAL_TABLET | Freq: Two times a day (BID) | ORAL | 0 refills | Status: DC
  Filled 2022-03-24: qty 14, 7d supply, fill #0

## 2022-03-24 NOTE — Progress Notes (Signed)

## 2022-05-04 ENCOUNTER — Ambulatory Visit
Admission: EM | Admit: 2022-05-04 | Discharge: 2022-05-04 | Disposition: A | Discharge status: Home / Self Care | Payer: No Typology Code available for payment source | Attending: Emergency Medicine | Admitting: Emergency Medicine

## 2022-05-04 DIAGNOSIS — Z113 Encounter for screening for infections with a predominantly sexual mode of transmission: Secondary | ICD-10-CM | POA: Insufficient documentation

## 2022-05-04 DIAGNOSIS — R369 Urethral discharge, unspecified: Secondary | ICD-10-CM | POA: Insufficient documentation

## 2022-05-04 NOTE — ED Provider Notes (Signed)
[facility]    CSN: [id number] Arrival date & time: 05/04/22  1116    HISTORY   Chief Complaint  Patient presents with   Penile Discharge   HPI Jermaine Blackwell is a pleasant, 21 y.o. male who presents to urgent care today. Patient complains of new onset of penile discharge for the past 4 days.  Patient denies genital lesion, burning with urination, blood in urine, testicular pain or swelling, scrotal pain or swelling, perineal pain, pain with defecation, lower abdominal pain, fever, body aches, rash or known exposure to STD.  The history is provided by the patient.   Past Medical History:  Diagnosis Date   ADD (attention deficit disorder with hyperactivity)    Anxiety    IBS (irritable bowel syndrome)    Seasonal allergies    Patient Active Problem List   Diagnosis Date Noted   Generalized anxiety disorder 04/20/2018   Abdominal pain 03/20/2018   History reviewed. No pertinent surgical history.  Home Medications    Prior to Admission medications   Medication Sig Start Date End Date Taking? Authorizing Provider  ANASPAZ 0.125 MG TBDP disintergrating tablet TAKE 1 TABLET BY MOUTH EVERY 4 HOURS AS NEEDED FOR ABDOMINAL CRAMPING CAN REPEAT ONCE IN 30 MINUTES IFNO RELIEF 03/02/18 02/08/20  [provider]    Family History Family History  Problem Relation Age of Onset   Anxiety disorder Mother    Colitis Mother    Lactose intolerance Mother    Diabetes Father    Hypertension Father    Bipolar disorder Maternal Grandmother    Depression Paternal Grandmother    Diabetes Paternal Grandfather    Gallstones Paternal Grandfather    Social History Social History   Tobacco Use   Smoking status: Never   Smokeless tobacco: Never  Vaping Use   Vaping Use: Never used  Substance Use Topics   Alcohol use: Yes    Comment: occ   Drug use: No   Allergies   Patient has no known allergies.  Review of Systems Review of Systems Pertinent findings  revealed after performing a 14 point review of systems has been noted in the history of present illness.  Physical Exam Triage Vital Signs ED Triage Vitals  Enc Vitals Group     BP 04/17/21 0827 (!) 147/82     Pulse Rate 04/17/21 0827 72     Resp 04/17/21 0827 18     Temp 04/17/21 0827 98.3 F (36.8 C)     Temp Source 04/17/21 0827 Oral     SpO2 04/17/21 0827 98 %     Weight --      Height --      Head Circumference --      Peak Flow --      Pain Score 04/17/21 0826 5     Pain Loc --      Pain Edu? --      Excl. in GC? --   No data found.  Updated Vital Signs BP (!) 130/12 (BP Location: Left Arm)   Pulse 87   Temp 99 F (37.2 C) (Oral)   Resp 16   SpO2 99%   Physical Exam Vitals and nursing note reviewed.  Constitutional:      General: He is not in acute distress.    Appearance: Normal appearance. He is not ill-appearing.  HENT:     Head: Normocephalic and atraumatic.  Eyes:     General: Lids are normal.  Right eye: No discharge.        Left eye: No discharge.     Extraocular Movements: Extraocular movements intact.     Conjunctiva/sclera: Conjunctivae normal.     Right eye: Right conjunctiva is not injected.     Left eye: Left conjunctiva is not injected.  Neck:     Trachea: Trachea and phonation normal.  Cardiovascular:     Rate and Rhythm: Normal rate and regular rhythm.     Pulses: Normal pulses.     Heart sounds: Normal heart sounds. No murmur heard.    No friction rub. No gallop.  Pulmonary:     Effort: Pulmonary effort is normal. No accessory muscle usage, prolonged expiration or respiratory distress.     Breath sounds: Normal breath sounds. No stridor, decreased air movement or transmitted upper airway sounds. No decreased breath sounds, wheezing, rhonchi or rales.  Chest:     Chest wall: No tenderness.  Genitourinary:    Comments: Pt politely declines GU exam, pt did provide a penile swab for testing.   Musculoskeletal:        General:  Normal range of motion.     Cervical back: Normal range of motion and neck supple. Normal range of motion.  Lymphadenopathy:     Cervical: No cervical adenopathy.  Skin:    General: Skin is warm and dry.     Findings: No erythema or rash.  Neurological:     General: No focal deficit present.     Mental Status: He is alert and oriented to person, place, and time.  Psychiatric:        Mood and Affect: Mood normal.        Behavior: Behavior normal.     Visual Acuity Right Eye Distance:   Left Eye Distance:   Bilateral Distance:    Right Eye Near:   Left Eye Near:    Bilateral Near:     UC Couse / Diagnostics / Procedures:     Radiology No results found.  Procedures Procedures (including critical care time) EKG  Pending results:  Labs Reviewed  CYTOLOGY, (ORAL, ANAL, URETHRAL) ANCILLARY ONLY    Medications Ordered in UC: Medications - No data to display  UC Diagnoses / Final Clinical Impressions(s)   I have reviewed the triage vital signs and the nursing notes.  Pertinent labs & imaging results that were available during my care of the patient were reviewed by me and considered in my medical decision making (see chart for details).    Final diagnoses:  Screening examination for STD (sexually transmitted disease)  Penile discharge    {LMSTDP:27058}  {LMUTIP:27060}  ED Prescriptions   None    PDMP not reviewed this encounter.  Disposition Upon Discharge:  Condition: stable for discharge home  Patient presented with concern for an acute illness with associated systemic symptoms and significant discomfort requiring urgent management. In my opinion, this is a condition that a prudent lay person (someone who possesses an average knowledge of health and medicine) may potentially expect to result in complications if not addressed urgently such as respiratory distress, impairment of bodily function or dysfunction of bodily organs.   As such, the patient has been  evaluated and assessed, work-up was performed and treatment was provided in alignment with urgent care protocols and evidence based medicine.  Patient/parent/caregiver has been advised that the patient may require follow up for further testing and/or treatment if the symptoms continue in spite of treatment, as clinically indicated and  appropriate.  Routine symptom specific, illness specific and/or disease specific instructions were discussed with the patient and/or caregiver at length.  Prevention strategies for avoiding STD exposure were also discussed.  The patient will follow up with their current PCP if and as advised. If the patient does not currently have a PCP we will assist them in obtaining one.   The patient may need specialty follow up if the symptoms continue, in spite of conservative treatment and management, for further workup, evaluation, consultation and treatment as clinically indicated and appropriate.  Patient/parent/caregiver verbalized understanding and agreement of plan as discussed.  All questions were addressed during visit.  Please see discharge instructions below for further details of plan.  Discharge Instructions:   Discharge Instructions      The results of your STD testing today which screens for gonorrhea, chlamydia, and trichomonas will be made posted to your MyChart account once it is complete.  This typically takes 2 to 4 days.  Please abstain from sexual intercourse of any kind, vaginal, oral or anal, until you have received the results of your STD testing.     If any of your results are abnormal, you will receive a phone call regarding treatment.  Prescriptions, if any are needed, will be provided for you at your pharmacy.     Please remember that the only way to prevent transmission of sexually transmitted disease when having sexual intercourse is to use condoms.  Repeat sexually transmitted infections can cause scarring of the tubes that carry sperm from your  testicles to your penis during ejaculation.  This can interfere with your your ability to have children.  Repeat exposures to sexually transmitted diseases can also increase your risk of contracting HIV as well as HPV, human papilloma virus which causes genital warts.   If you have not had complete resolution of your symptoms after completing any needed treatment, please return for repeat evaluation.         This office note has been dictated using Teaching laboratory technician.  Unfortunately, this method of dictation can sometimes lead to typographical or grammatical errors.  I apologize for your inconvenience in advance if this occurs.  Please do not hesitate to reach out to me if clarification is needed.

## 2022-05-04 NOTE — Discharge Instructions (Addendum)
The results of your STD testing today which screens for gonorrhea, chlamydia, and trichomonas will be made posted to your MyChart account once it is complete.  This typically takes 2 to 4 days.  Please abstain from sexual intercourse of any kind, vaginal, oral or anal, until you have received the results of your STD testing.     If any of your results are abnormal, you will receive a phone call regarding treatment.  Prescriptions, if any are needed, will be provided for you at your pharmacy.     Please remember that the only way to prevent transmission of sexually transmitted disease when having sexual intercourse is to use condoms.  Repeat sexually transmitted infections can cause scarring of the tubes that carry sperm from your testicles to your penis during ejaculation.  This can interfere with your your ability to have children.  Repeat exposures to sexually transmitted diseases can also increase your risk of contracting HIV as well as HPV, human papilloma virus which causes genital warts.   If you have not had complete resolution of your symptoms after completing any needed treatment, please return for repeat evaluation.

## 2022-05-04 NOTE — ED Triage Notes (Signed)
Pt c/o penile discharge x 4 days-NAD-steady gait

## 2022-05-06 LAB — CYTOLOGY, (ORAL, ANAL, URETHRAL) ANCILLARY ONLY
Chlamydia: NEGATIVE
Comment: NEGATIVE
Comment: NEGATIVE
Comment: NORMAL
Neisseria Gonorrhea: NEGATIVE
Trichomonas: NEGATIVE

## 2022-07-24 ENCOUNTER — Encounter: Payer: Self-pay | Admitting: Nurse Practitioner

## 2023-01-06 ENCOUNTER — Encounter: Payer: Self-pay | Admitting: Nurse Practitioner

## 2023-01-06 ENCOUNTER — Ambulatory Visit (INDEPENDENT_AMBULATORY_CARE_PROVIDER_SITE_OTHER): Payer: 59 | Admitting: Nurse Practitioner

## 2023-01-06 VITALS — BP 120/70 | HR 98 | Temp 98.0°F | Ht 67.0 in | Wt 146.4 lb

## 2023-01-06 DIAGNOSIS — Z13220 Encounter for screening for lipoid disorders: Secondary | ICD-10-CM

## 2023-01-06 DIAGNOSIS — Z0000 Encounter for general adult medical examination without abnormal findings: Secondary | ICD-10-CM | POA: Diagnosis not present

## 2023-01-06 DIAGNOSIS — F320 Major depressive disorder, single episode, mild: Secondary | ICD-10-CM | POA: Insufficient documentation

## 2023-01-06 DIAGNOSIS — Z23 Encounter for immunization: Secondary | ICD-10-CM | POA: Insufficient documentation

## 2023-01-06 DIAGNOSIS — F411 Generalized anxiety disorder: Secondary | ICD-10-CM

## 2023-01-06 MED ORDER — HYDROXYZINE HCL 10 MG PO TABS: 10.0000 mg | ORAL_TABLET | Freq: Three times a day (TID) | ORAL | 0 refills | Status: DC | PRN

## 2023-01-06 MED ORDER — HPV 9-VALENT RECOMB VACCINE IM SUSY
0.5000 mL | PREFILLED_SYRINGE | Freq: Once | INTRAMUSCULAR | 0 refills | Status: AC
Start: 2023-01-06 — End: 2023-01-06

## 2023-01-06 NOTE — Assessment & Plan Note (Signed)
Rx sent to pharmacy   

## 2023-01-06 NOTE — Assessment & Plan Note (Signed)
Anxiety score is 7, started on atarax as needed, aware to take at home first to see if it makes him drowsy. RTO in 6 weeks for f/u

## 2023-01-06 NOTE — Assessment & Plan Note (Signed)
Behavior modifications discussed and diet history reviewed.   Pt will continue to exercise regularly and modify diet with low GI, plant based foods and decrease intake of processed foods.  Recommend intake of daily multivitamin, Vitamin D, and calcium.  Recommend for preventive screenings, as well as recommend immunizations that include influenza, TDAP (done today) Declined STD screening today

## 2023-01-06 NOTE — Patient Instructions (Addendum)
I have sent your prescription for your HPV vaccine to the pharmacy

## 2023-01-06 NOTE — Progress Notes (Signed)
Madelaine Bhat, CMA,acting as a Neurosurgeon for Arnette Felts, FNP.,have documented all relevant documentation on the behalf of Arnette Felts, FNP,as directed by  Arnette Felts, FNP while in the presence of Arnette Felts, FNP.  Subjective:   Patient ID: Jermaine Blackwell , male    DOB: Jun 19, 2001 , 22 y.o.   MRN: [id number]  Chief Complaint  Patient presents with   Annual Exam    HPI  Patient presents today for HM. Patient reports compliance with medications. Patient denies any chest pain, SOB, or headaches. Patient has no other concerns today.   BP Readings from Last 3 Encounters: 01/06/23 : 120/70 05/04/22 : (!) 130/12 12/23/21 : 120/60       Past Medical History:  Diagnosis Date   ADD (attention deficit disorder with hyperactivity)    Anxiety    Depression    GERD (gastroesophageal reflux disease)    might be stress related   IBS (irritable bowel syndrome)    Seasonal allergies      Family History  Problem Relation Age of Onset   Anxiety disorder Mother    Colitis Mother    Lactose intolerance Mother    Diabetes Father    Hypertension Father    Bipolar disorder Maternal Grandmother    Depression Paternal Grandmother    Diabetes Paternal Grandfather    Gallstones Paternal Grandfather      Current Outpatient Medications:    hpv 9-valent vaccine (GARDASIL 9) prefilled syringe, Inject 0.5 mLs into the muscle once for 1 dose., Disp: 0.5 mL, Rfl: 0   hydrOXYzine (ATARAX) 10 MG tablet, Take 1 tablet (10 mg total) by mouth 3 (three) times daily as needed., Disp: 30 tablet, Rfl: 0   No Known Allergies   Men's preventive visit. Patient Health Questionnaire (PHQ-2) is  Flowsheet Row Office Visit from 01/06/2023 in [facility]  PHQ-2 Total Score 2      Patient is on a Pescatarian diet, tries to avoid eating junk food. Not exercising regularly.  Marital status: Single. Relevant history for alcohol use is:  Social History   Substance and Sexual  Activity  Alcohol Use Yes   Comment: occ   Relevant history for tobacco use is:  Social History   Tobacco Use  Smoking Status Never  Smokeless Tobacco Never  .   Review of Systems  Constitutional: Negative.   HENT: Negative.    Eyes: Negative.   Respiratory: Negative.    Cardiovascular: Negative.   Gastrointestinal: Negative.   Endocrine: Negative.   Genitourinary: Negative.   Musculoskeletal: Negative.   Allergic/Immunologic: Negative.   Neurological: Negative.   Hematological: Negative.   Psychiatric/Behavioral: Negative.       Today's Vitals   01/06/23 0910  BP: 120/70  Pulse: 98  Temp: 98 F (36.7 C)  TempSrc: Oral  Weight: 146 lb 6.4 oz (66.4 kg)  Height: 5\' 7"  (1.702 m)  PainSc: 1   PainLoc: Shoulder   Body mass index is 22.93 kg/m.  Wt Readings from Last 3 Encounters:  01/06/23 146 lb 6.4 oz (66.4 kg)  12/23/21 149 lb (67.6 kg)  05/11/21 135 lb (61.2 kg)    Objective:  Physical Exam Vitals reviewed.  Constitutional:      General: He is not in acute distress.    Appearance: Normal appearance.  HENT:     Head: Normocephalic and atraumatic.     Right Ear: Tympanic membrane, ear canal and external ear normal. There is no impacted cerumen.  Left Ear: Tympanic membrane, ear canal and external ear normal. There is no impacted cerumen.     Nose: Nose normal.     Mouth/Throat:     Mouth: Mucous membranes are moist.  Cardiovascular:     Rate and Rhythm: Normal rate and regular rhythm.     Pulses: Normal pulses.     Heart sounds: Normal heart sounds. No murmur heard. Pulmonary:     Effort: Pulmonary effort is normal. No respiratory distress.     Breath sounds: Normal breath sounds. No wheezing.  Abdominal:     General: Abdomen is flat. Bowel sounds are normal. There is no distension.     Palpations: Abdomen is soft.  Musculoskeletal:        General: No swelling or tenderness. Normal range of motion.     Cervical back: Normal range of motion and  neck supple. No rigidity.  Skin:    General: Skin is warm and dry.     Capillary Refill: Capillary refill takes less than 2 seconds.     Coloration: Skin is not jaundiced.     Findings: No bruising.  Neurological:     General: No focal deficit present.     Mental Status: He is alert and oriented to person, place, and time.     Cranial Nerves: No cranial nerve deficit.     Motor: No weakness.  Psychiatric:        Mood and Affect: Mood normal.        Behavior: Behavior normal.        Thought Content: Thought content normal.        Judgment: Judgment normal.        01/06/2023    9:12 AM  GAD 7 : Generalized Anxiety Score  Nervous, Anxious, on Edge 1  Control/stop worrying 2  Worry too much - different things 2  Trouble relaxing 0  Restless 1  Easily annoyed or irritable 1  Afraid - awful might happen 0  Total GAD 7 Score 7  Anxiety Difficulty Somewhat difficult      01/06/2023    9:12 AM 12/23/2021    8:50 AM 12/10/2020   11:04 AM 09/16/2015    5:38 PM 09/09/2015    6:34 PM  Depression screen PHQ 2/9  Decreased Interest 1 0 0 0 0  Down, Depressed, Hopeless 1 0 0 0 0  PHQ - 2 Score 2 0 0 0 0  Altered sleeping 2  0    Tired, decreased energy 2  0    Change in appetite 1  0    Feeling bad or failure about yourself  0  0    Trouble concentrating 1  0    Moving slowly or fidgety/restless 1  0    Suicidal thoughts 0  0    PHQ-9 Score 9  0    Difficult doing work/chores Somewhat difficult        Assessment And Plan:    Annual physical exam Assessment & Plan: Behavior modifications discussed and diet history reviewed.   Pt will continue to exercise regularly and modify diet with low GI, plant based foods and decrease intake of processed foods.  Recommend intake of daily multivitamin, Vitamin D, and calcium.  Recommend for preventive screenings, as well as recommend immunizations that include influenza, TDAP (done today) Declined STD screening today   Orders: -      CMP14+EGFR -     CBC with Differential/Platelet  Generalized anxiety disorder Assessment &  Plan: Anxiety score is 7, started on atarax as needed, aware to take at home first to see if it makes him drowsy. RTO in 6 weeks for f/u  Orders: -     hydrOXYzine HCl; Take 1 tablet (10 mg total) by mouth 3 (three) times daily as needed.  Dispense: 30 tablet; Refill: 0  Current mild episode of major depressive disorder without prior episode [facility]) Assessment & Plan: Depression screen score is 9, declines medications or counseling at this time.    Need for Tdap vaccination Assessment & Plan: Tetanus administered in office   Orders: -     Tdap vaccine greater than or equal to 7yo IM  Encounter for screening for lipid disorder -     Lipid panel  Need for HPV vaccine Assessment & Plan: Rx sent to pharmacy   Orders: -     HPV 9-Valent Recomb Vaccine; Inject 0.5 mLs into the muscle once for 1 dose.  Dispense: 0.5 mL; Refill: 0   Return for 6 week anxiety f/u, 1 year HM, schedule covid at least 2 weeks from now.  Patient was given opportunity to ask questions. Patient verbalized understanding of the plan and was able to repeat key elements of the plan. All questions were answered to their satisfaction.   Arnette Felts, FNP  I, Arnette Felts, FNP, have reviewed all documentation for this visit. The documentation on 01/06/23 for the exam, diagnosis, procedures, and orders are all accurate and complete.

## 2023-01-06 NOTE — Assessment & Plan Note (Addendum)
Tetanus administered in office

## 2023-01-06 NOTE — Assessment & Plan Note (Signed)
Depression screen score is 9, declines medications or counseling at this time.

## 2023-01-08 LAB — CBC WITH DIFFERENTIAL/PLATELET
Basophils Absolute: 0 10*3/uL (ref 0.0–0.2)
Basos: 0 %
EOS (ABSOLUTE): 0.2 10*3/uL (ref 0.0–0.4)
Eos: 3 %
Hematocrit: 51.6 % — ABNORMAL HIGH (ref 37.5–51.0)
Hemoglobin: 17.1 g/dL (ref 13.0–17.7)
Lymphocytes Absolute: 3.6 10*3/uL — ABNORMAL HIGH (ref 0.7–3.1)
Lymphs: 52 %
MCH: 27.8 pg (ref 26.6–33.0)
MCHC: 33.1 g/dL (ref 31.5–35.7)
MCV: 84 fL (ref 79–97)
Monocytes Absolute: 0.5 10*3/uL (ref 0.1–0.9)
Monocytes: 7 %
Neutrophils Absolute: 2.6 10*3/uL (ref 1.4–7.0)
Neutrophils: 38 %
Platelets: 486 10*3/uL — ABNORMAL HIGH (ref 150–450)
RBC: 6.16 x10E6/uL — ABNORMAL HIGH (ref 4.14–5.80)
RDW: 14 % (ref 11.6–15.4)
WBC: 6.9 10*3/uL (ref 3.4–10.8)

## 2023-01-08 LAB — CMP14+EGFR
ALT: 16 IU/L (ref 0–44)
AST: 19 IU/L (ref 0–40)
Albumin: 4.8 g/dL (ref 4.3–5.2)
Alkaline Phosphatase: 65 IU/L (ref 44–121)
BUN/Creatinine Ratio: 15 (ref 9–20)
BUN: 16 mg/dL (ref 6–20)
Bilirubin Total: 0.6 mg/dL (ref 0.0–1.2)
CO2: 25 mmol/L (ref 20–29)
Calcium: 10.3 mg/dL — ABNORMAL HIGH (ref 8.7–10.2)
Chloride: 102 mmol/L (ref 96–106)
Creatinine, Ser: 1.09 mg/dL (ref 0.76–1.27)
Globulin, Total: 2.4 g/dL (ref 1.5–4.5)
Glucose: 100 mg/dL — ABNORMAL HIGH (ref 70–99)
Potassium: 4.4 mmol/L (ref 3.5–5.2)
Sodium: 143 mmol/L (ref 134–144)
Total Protein: 7.2 g/dL (ref 6.0–8.5)
eGFR: 98 mL/min/{1.73_m2} (ref >59)

## 2023-01-08 LAB — LIPID PANEL
Chol/HDL Ratio: 2.9 ratio (ref 0.0–5.0)
Cholesterol, Total: 151 mg/dL (ref 100–199)
HDL: 52 mg/dL (ref >39)
LDL Chol Calc (NIH): 78 mg/dL (ref 0–99)
Triglycerides: 117 mg/dL (ref 0–149)
VLDL Cholesterol Cal: 21 mg/dL (ref 5–40)

## 2023-01-27 IMAGING — CR DG CHEST 2V
2 series · 2 of 2 positions shown · non-contrast
Comparison: None.

CLINICAL DATA: Cough for 2 weeks

EXAM:
CHEST - 2 VIEW

[w chest pa]
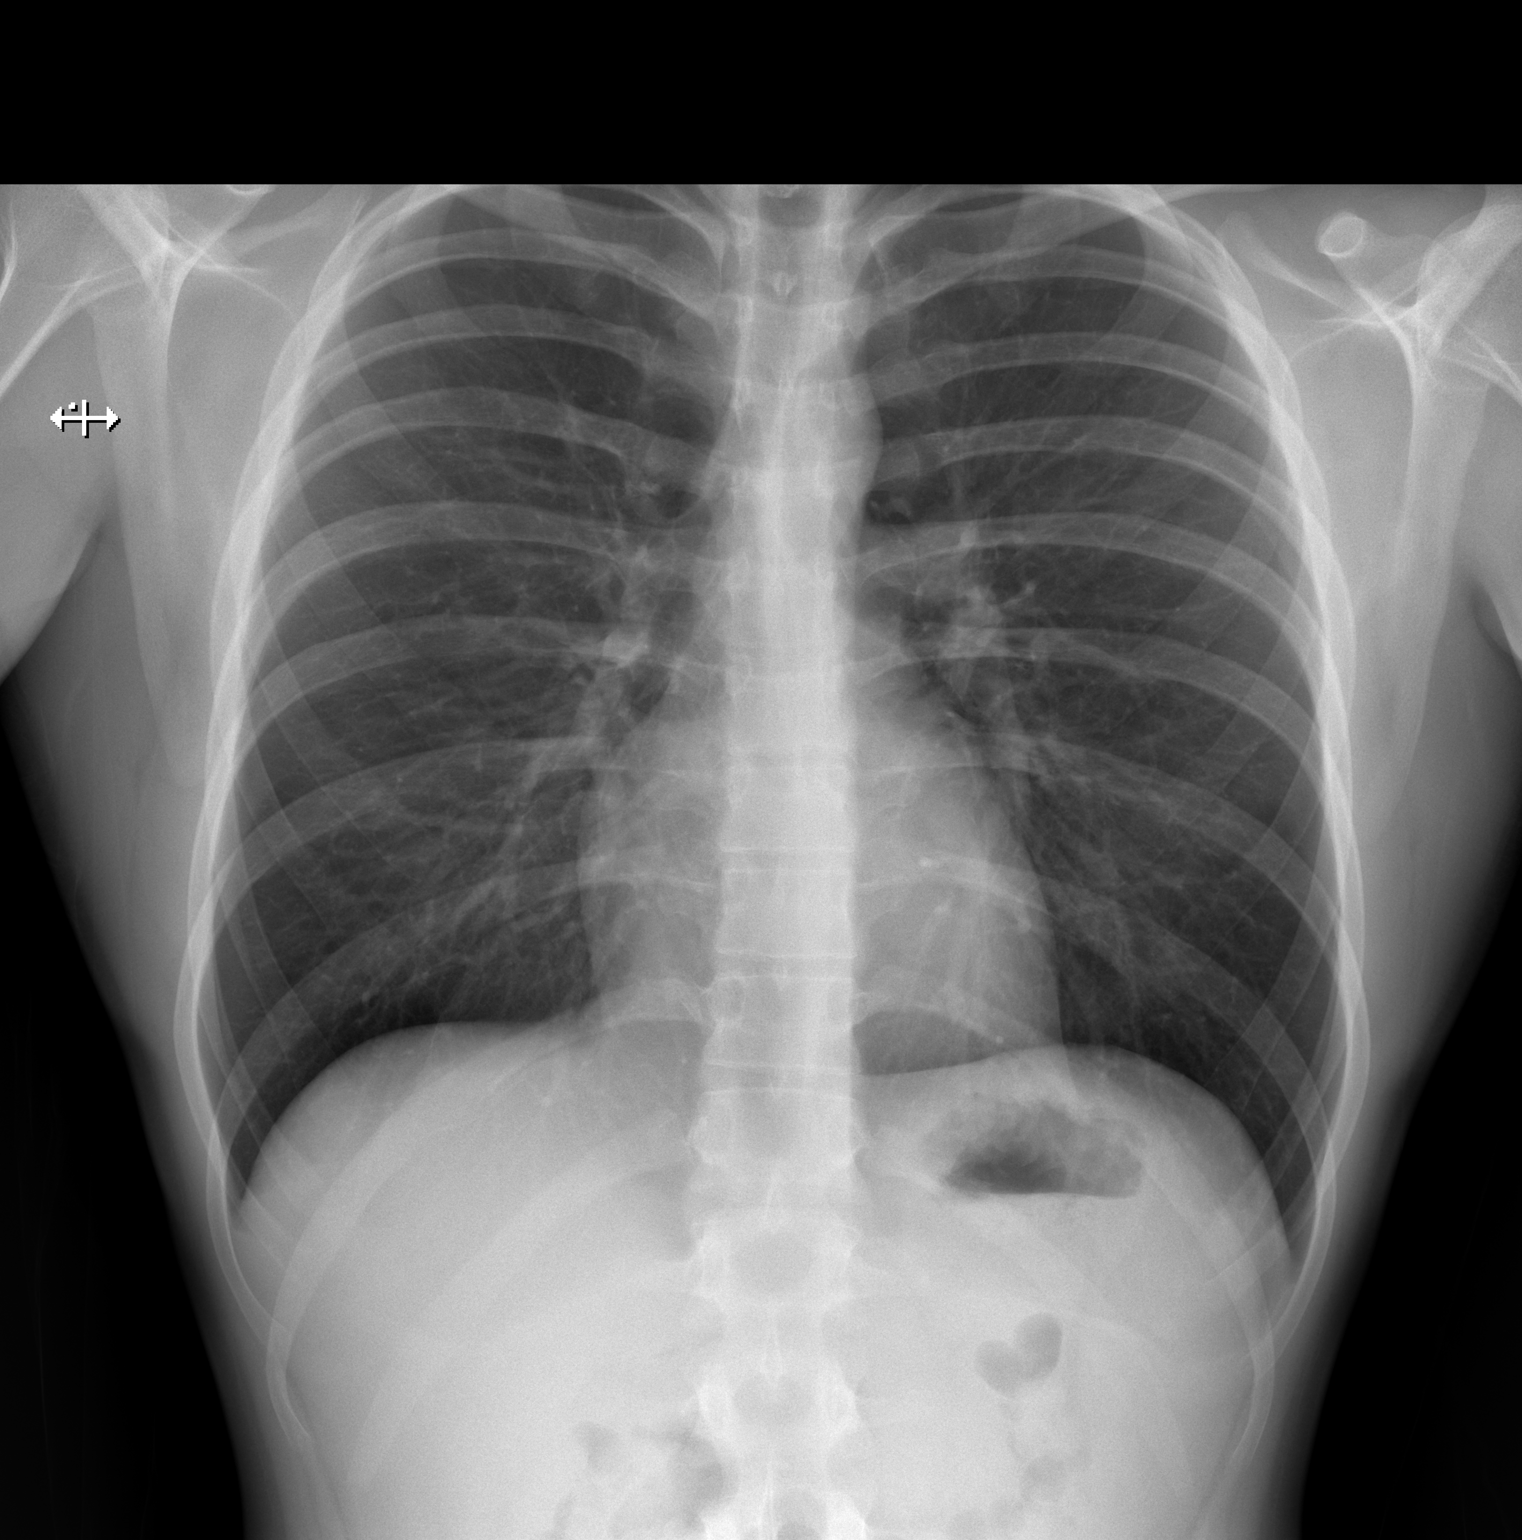

[w chest lat]
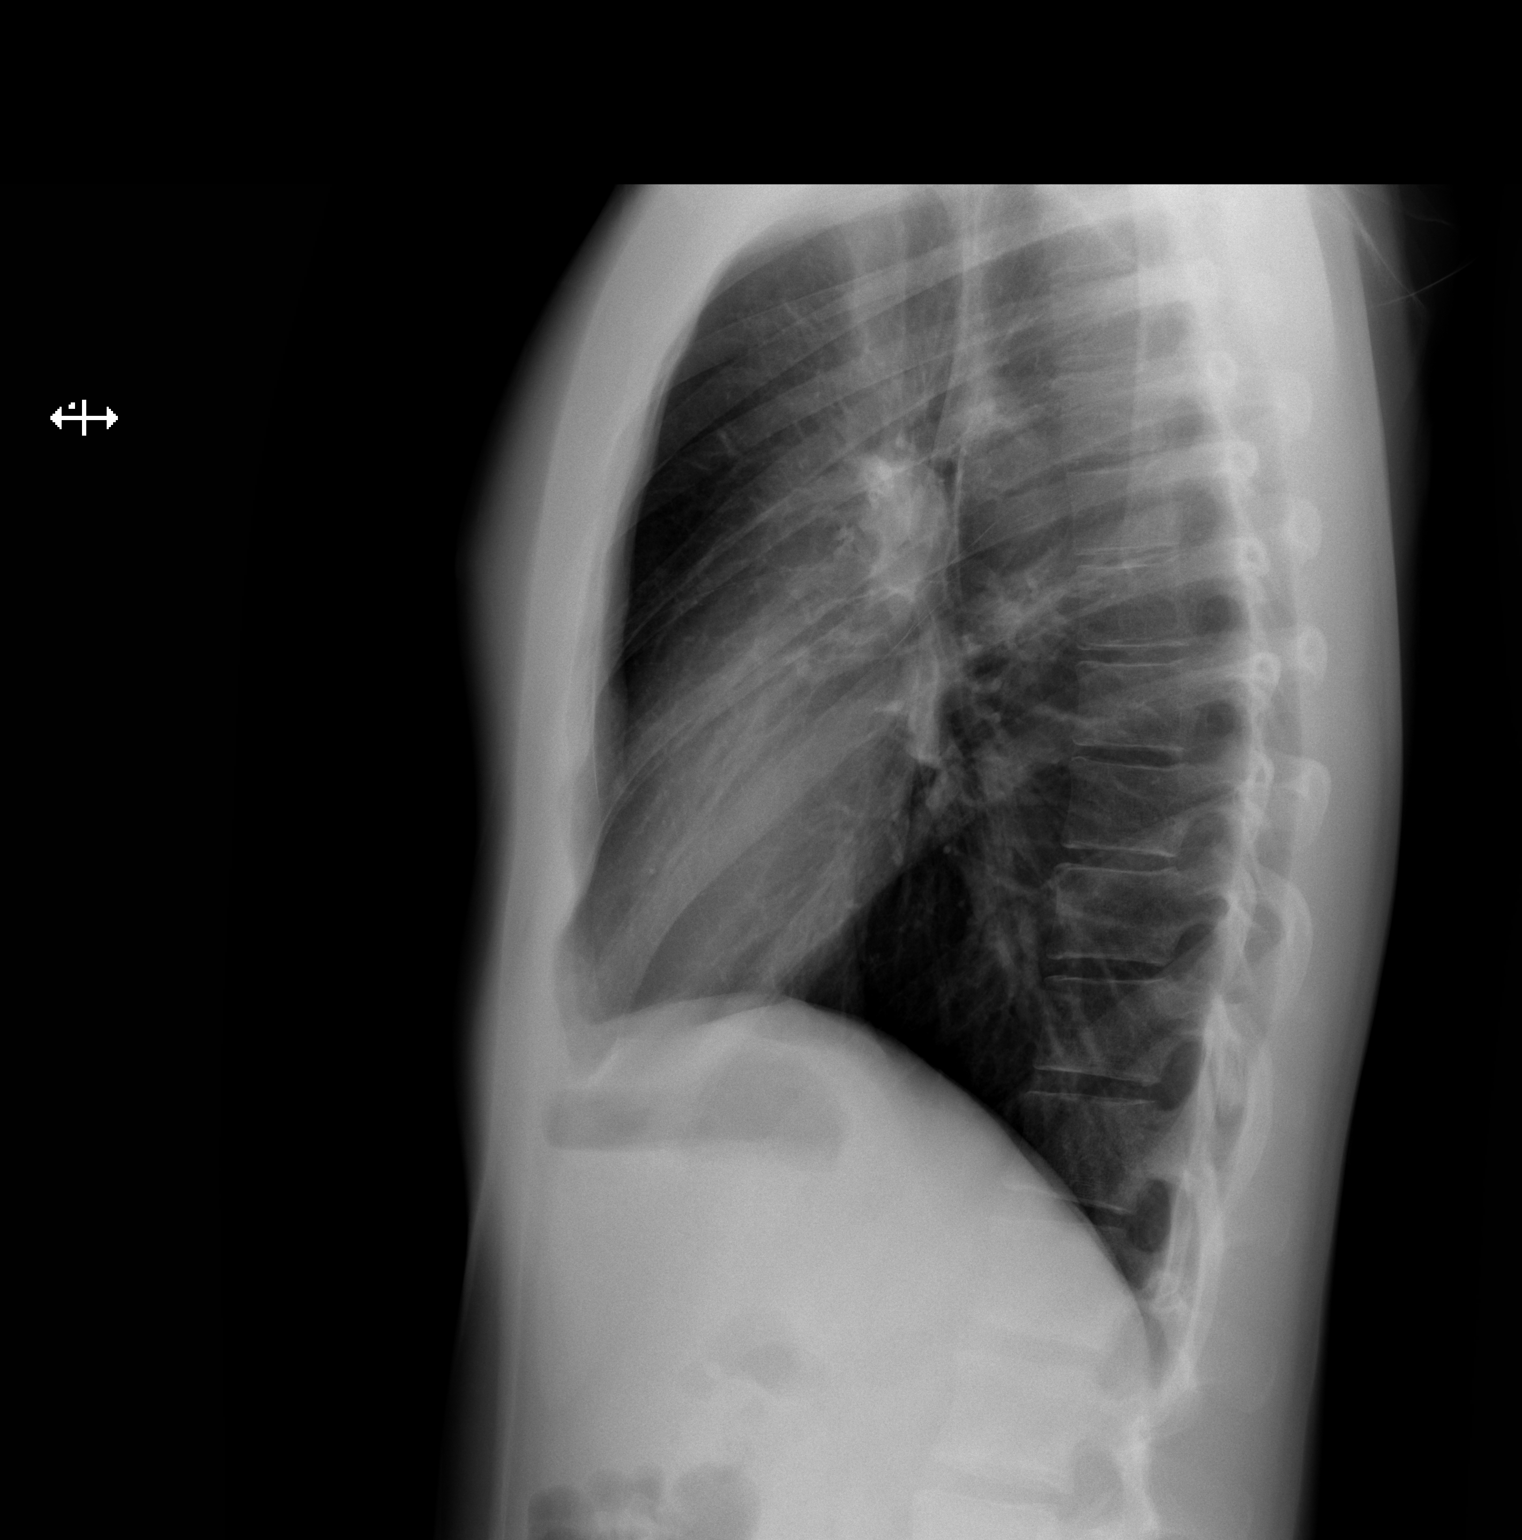

[2 of 2 positions shown; findings below may reference images not displayed]

FINDINGS: The heart size and mediastinal contours are within normal limits.
Both lungs are clear. The visualized skeletal structures are
unremarkable.
IMPRESSION: No active cardiopulmonary disease.

## 2023-02-17 ENCOUNTER — Ambulatory Visit: Payer: 59 | Admitting: Nurse Practitioner

## 2023-02-17 NOTE — Progress Notes (Deleted)
Madelaine Bhat, CMA,acting as a Neurosurgeon for Arnette Felts, FNP.,have documented all relevant documentation on the behalf of Arnette Felts, FNP,as directed by  Arnette Felts, FNP while in the presence of Arnette Felts, FNP.  Subjective:  Patient ID: Jermaine Blackwell , male    DOB: [birth year] , 22 y.o.   MRN: [id number]  No chief complaint on file.   HPI  Patient presents today for an anxiety follow up, patient reports compliance with medications. Patient denies any SOB, Chest pains, or headaches. Patient has no concerns today.     Past Medical History:  Diagnosis Date  . ADD (attention deficit disorder with hyperactivity)   . Anxiety   . Depression   . GERD (gastroesophageal reflux disease)    might be stress related  . IBS (irritable bowel syndrome)   . Seasonal allergies      Family History  Problem Relation Age of Onset  . Anxiety disorder Mother   . Colitis Mother   . Lactose intolerance Mother   . Diabetes Father   . Hypertension Father   . Bipolar disorder Maternal Grandmother   . Depression Paternal Grandmother   . Diabetes Paternal Grandfather   . Gallstones Paternal Grandfather      Current Outpatient Medications:  .  hydrOXYzine (ATARAX) 10 MG tablet, Take 1 tablet (10 mg total) by mouth 3 (three) times daily as needed., Disp: 30 tablet, Rfl: 0   No Known Allergies   Review of Systems   There were no vitals filed for this visit. There is no height or weight on file to calculate BMI.  Wt Readings from Last 3 Encounters:  01/06/23 146 lb 6.4 oz (66.4 kg)  12/23/21 149 lb (67.6 kg)  05/11/21 135 lb (61.2 kg)    The ASCVD Risk score (Arnett DK, et al., 2019) failed to calculate for the following reasons:   The 2019 ASCVD risk score is only valid for ages 33 to 3  Objective:  Physical Exam      Assessment And Plan:  There are no diagnoses linked to this encounter.  No follow-ups on file.  Patient was given opportunity to ask questions. Patient verbalized  understanding of the plan and was able to repeat key elements of the plan. All questions were answered to their satisfaction.    Jeanell Sparrow, FNP, have reviewed all documentation for this visit. The documentation on 02/17/23 for the exam, diagnosis, procedures, and orders are all accurate and complete.   IF YOU HAVE BEEN REFERRED TO A SPECIALIST, IT MAY TAKE 1-2 WEEKS TO SCHEDULE/PROCESS THE REFERRAL. IF YOU HAVE NOT HEARD FROM US/SPECIALIST IN TWO WEEKS, PLEASE GIVE [place] A CALL AT [phone].

## 2023-02-28 NOTE — Progress Notes (Unsigned)
Madelaine Bhat, CMA,acting as a Neurosurgeon for Arnette Felts, FNP.,have documented all relevant documentation on the behalf of Arnette Felts, FNP,as directed by  Arnette Felts, FNP while in the presence of Arnette Felts, FNP.  Subjective:  Patient ID: Jermaine Blackwell , male    DOB: [birth year] , 22 y.o.   MRN: [id number]  No chief complaint on file.   HPI  Patient presents today for an anxiety follow up, patient reports compliance with medications. Patient denies any SOB, Chest pains, or headaches. Patient has no concerns today.     Past Medical History:  Diagnosis Date  . ADD (attention deficit disorder with hyperactivity)   . Anxiety   . Depression   . GERD (gastroesophageal reflux disease)    might be stress related  . IBS (irritable bowel syndrome)   . Seasonal allergies      Family History  Problem Relation Age of Onset  . Anxiety disorder Mother   . Colitis Mother   . Lactose intolerance Mother   . Diabetes Father   . Hypertension Father   . Bipolar disorder Maternal Grandmother   . Depression Paternal Grandmother   . Diabetes Paternal Grandfather   . Gallstones Paternal Grandfather      Current Outpatient Medications:  .  hydrOXYzine (ATARAX) 10 MG tablet, Take 1 tablet (10 mg total) by mouth 3 (three) times daily as needed., Disp: 30 tablet, Rfl: 0   No Known Allergies   Review of Systems   There were no vitals filed for this visit. There is no height or weight on file to calculate BMI.  Wt Readings from Last 3 Encounters:  01/06/23 146 lb 6.4 oz (66.4 kg)  12/23/21 149 lb (67.6 kg)  05/11/21 135 lb (61.2 kg)    The ASCVD Risk score (Arnett DK, et al., 2019) failed to calculate for the following reasons:   The 2019 ASCVD risk score is only valid for ages 57 to 37  Objective:  Physical Exam      Assessment And Plan:  Generalized anxiety disorder    No follow-ups on file.  Patient was given opportunity to ask questions. Patient verbalized understanding of  the plan and was able to repeat key elements of the plan. All questions were answered to their satisfaction.    Jeanell Sparrow, FNP, have reviewed all documentation for this visit. The documentation on 02/28/23 for the exam, diagnosis, procedures, and orders are all accurate and complete.   IF YOU HAVE BEEN REFERRED TO A SPECIALIST, IT MAY TAKE 1-2 WEEKS TO SCHEDULE/PROCESS THE REFERRAL. IF YOU HAVE NOT HEARD FROM US/SPECIALIST IN TWO WEEKS, PLEASE GIVE [place] A CALL AT [phone].

## 2023-03-01 ENCOUNTER — Encounter: Payer: Self-pay | Admitting: Nurse Practitioner

## 2023-03-01 ENCOUNTER — Telehealth (INDEPENDENT_AMBULATORY_CARE_PROVIDER_SITE_OTHER): Payer: 59 | Admitting: Nurse Practitioner

## 2023-03-01 DIAGNOSIS — F411 Generalized anxiety disorder: Secondary | ICD-10-CM

## 2023-03-01 DIAGNOSIS — Z2821 Immunization not carried out because of patient refusal: Secondary | ICD-10-CM | POA: Insufficient documentation

## 2023-03-01 NOTE — Assessment & Plan Note (Signed)

## 2023-03-01 NOTE — Assessment & Plan Note (Addendum)
Recommended he take the atarax at least 3-4 times a week.  F/u in 8 - 12 weeks if continues to not be effective, will start on daily anti anxiety medications

## 2023-03-01 NOTE — Progress Notes (Signed)
Virtual Visit via Mychart   This visit type was conducted due to national recommendations for restrictions regarding the COVID-19 Pandemic (e.g. social distancing) in an effort to limit this patient's exposure and mitigate transmission in our community.  Due to his co-morbid illnesses, this patient is at least at moderate risk for complications without adequate follow up.  This format is felt to be most appropriate for this patient at this time.  All issues noted in this document were discussed and addressed.  A limited physical exam was performed with this format.    This visit type was conducted due to national recommendations for restrictions regarding the COVID-19 Pandemic (e.g. social distancing) in an effort to limit this patient's exposure and mitigate transmission in our community.  Patients identity confirmed using two different identifiers.  This format is felt to be most appropriate for this patient at this time.  All issues noted in this document were discussed and addressed.  No physical exam was performed (except for noted visual exam findings with Video Visits).    Date:  03/01/2023   ID:  Jermaine Blackwell, DOB 2001/02/12, MRN [id number]  Patient Location:  Spoke with Jermaine Blackwell at home  Provider location:   Office    Chief Complaint:  f/u anxiety  History of Present Illness:    Jermaine Blackwell is a 22 y.o. male who presents via video conferencing for a telehealth visit today.    The patient does have symptoms concerning for COVID-19 infection (fever, chills, cough, or new shortness of breath).   Patient presents today for an anxiety follow up, patient reports compliance with medications. Patient denies any SOB, Chest pains, or headaches. Patient has no concerns today. He is taking the hydroxyzine once a week. He took the medication when he was going to be in a high anxiety level. He was not getting as hot. But was still  nervous to talk to people. 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      Past Medical History:  Diagnosis Date   ADD (attention deficit disorder with hyperactivity)    Anxiety    Depression    GERD (gastroesophageal reflux disease)    might be stress related   IBS (irritable bowel syndrome)    Seasonal allergies    History reviewed. No pertinent surgical history.   Current Meds  Medication Sig   hydrOXYzine (ATARAX) 10 MG tablet Take 1 tablet (10 mg total) by mouth 3 (three) times daily as needed.     Allergies:   Patient has no known allergies.   Social History   Tobacco Use   Smoking status: Never   Smokeless tobacco: Never  Vaping Use   Vaping status: Never Used  Substance Use Topics   Alcohol use: Yes    Comment: occ   Drug use: No     Family Hx: The patient's family history includes Anxiety disorder in his mother; Bipolar disorder in his maternal grandmother; Colitis in his mother; Depression in his paternal grandmother; Diabetes in his father and paternal grandfather; Gallstones in his paternal grandfather; Hypertension in his father; Lactose intolerance in his mother.  ROS:   Please see the history of present illness.    Review of Systems  Constitutional: Negative.   Respiratory: Negative.    Cardiovascular: Negative.   Musculoskeletal: Negative.   Psychiatric/Behavioral: Negative.      All other systems reviewed and are negative.   Labs/Other Tests and Data Reviewed:  Recent Labs: 01/06/2023: ALT 16; BUN 16; Creatinine, Ser 1.09; Hemoglobin 17.1; Platelets 486; Potassium 4.4; Sodium 143   Recent Lipid Panel Lab Results  Component Value Date/Time   CHOL 151 01/06/2023 09:53 AM   TRIG 117 01/06/2023 09:53 AM   HDL 52 01/06/2023 09:53 AM   CHOLHDL 2.9 01/06/2023 09:53 AM   LDLCALC 78 01/06/2023 09:53 AM    Wt Readings from Last 3 Encounters:  01/06/23 146 lb 6.4 oz (66.4 kg)  12/23/21 149 lb (67.6 kg)  05/11/21 135 lb (61.2 kg)     Exam:    Vital Signs:  There were  no vitals taken for this visit.    Physical Exam Constitutional:      General: He is not in acute distress.    Appearance: Normal appearance.  Pulmonary:     Effort: Pulmonary effort is normal. No respiratory distress.  Neurological:     General: No focal deficit present.     Mental Status: He is alert and oriented to person, place, and time. Mental status is at baseline.     Cranial Nerves: No cranial nerve deficit.  Psychiatric:        Mood and Affect: Mood and affect normal.        Behavior: Behavior normal.        Thought Content: Thought content normal.        Cognition and Memory: Memory normal.        Judgment: Judgment normal.     ASSESSMENT & PLAN:    Generalized anxiety disorder Assessment & Plan: Recommended he take the atarax at least 3-4 times a week.  F/u in 8 - 12 weeks if continues to not be effective, will start on daily anti anxiety medications   Influenza vaccination declined Assessment & Plan: Patient declined influenza vaccination at this time. Patient is aware that influenza vaccine prevents illness in 70% of healthy people, and reduces hospitalizations to 30-70% in elderly. This vaccine is recommended annually. Education has been provided regarding the importance of this vaccine but patient still declined. Advised may receive this vaccine at local pharmacy or Health Dept.or vaccine clinic. Aware to provide a copy of the vaccination record if obtained from local pharmacy or Health Dept.  Pt is willing to accept risk associated with refusing vaccination.     COVID-19 Education: The signs and symptoms of COVID-19 were discussed with the patient and how to seek care for testing (follow up with PCP or arrange E-visit).  The importance of social distancing was discussed today.  Patient Risk:   After full review of this patients clinical status, I feel that they are at least moderate risk at this time.  Time:   Today, I have spent 6 minutes/ seconds with the  patient with telehealth technology discussing above diagnoses.     Medication Adjustments/Labs and Tests Ordered: Current medicines are reviewed at length with the patient today.  Concerns regarding medicines are outlined above.   Tests Ordered: No orders of the defined types were placed in this encounter.   Medication Changes: No orders of the defined types were placed in this encounter.   Disposition:  Follow up in 8-12 week(s)  Signed, Arnette Felts, FNP

## 2023-08-04 ENCOUNTER — Telehealth: Payer: BC Managed Care – PPO | Admitting: Physician Assistant

## 2023-08-04 DIAGNOSIS — R6889 Other general symptoms and signs: Secondary | ICD-10-CM

## 2023-08-04 NOTE — Progress Notes (Signed)
E visit for Flu like symptoms   We are sorry that you are not feeling well.  Here is how we plan to help! Based on what you have shared with me it looks like you may have flu-like symptoms that should be watched but do not seem to indicate anti-viral treatment.  Influenza or "the flu" is   an infection caused by a respiratory virus. The flu virus is highly contagious and persons who did not receive their yearly flu vaccination may "catch" the flu from close contact.  We have anti-viral medications to treat the viruses that cause this infection. They are not a "cure" and only shorten the course of the infection. These prescriptions are most effective when they are given within the first 2 days of "flu" symptoms. Antiviral medication are indicated if you have a high risk of complications from the flu. You should  also consider an antiviral medication if you are in close contact with someone who is at risk. These medications can help patients avoid complications from the flu  but have side effects that you should know. Possible side effects from Tamiflu or oseltamivir include nausea, vomiting, diarrhea, dizziness, headaches, eye redness, sleep problems or other respiratory symptoms. You should not take Tamiflu if you have an allergy to oseltamivir or any to the ingredients in Tamiflu.  Based upon your symptoms and potential risk factors I recommend that you follow the flu symptoms recommendation that I have listed below.  Please keep well-hydrated and try to get plenty of rest. If you have a humidifier, place it in the bedroom and run it at night. Start a saline nasal rinse for nasal congestion. You can consider use of a nasal steroid spray like Flonase or Nasacort OTC. You can alternate between Tylenol and Ibuprofen if needed for fever, body aches, headache and/or throat pain. Salt water-gargles and chloraseptic spray can be very beneficial for sore throat. Mucinex-DM for congestion or cough. Please  take all prescribed medications as directed.  Remain out of work until [employer] for 24 hours without a fever-reducing medication, and you are feeling better.  You should mask until symptoms are resolved.  If anything worsens despite treatment, you need to be evaluated in-person. Please do not delay care.  ANYONE WHO HAS FLU SYMPTOMS SHOULD: Stay home. The flu is highly contagious and going out or to work exposes others! Be sure to drink plenty of fluids. Water is fine as well as fruit juices, sodas and electrolyte beverages. You may want to stay away from caffeine or alcohol. If you are nauseated, try taking small sips of liquids. How do you know if you are getting enough fluid? Your urine should be a pale yellow or almost colorless. Get rest. Taking a steamy shower or using a humidifier may help nasal congestion and ease sore throat pain. Using a saline nasal spray works much the same way. Cough drops, hard candies and sore throat lozenges may ease your cough. Line up a caregiver. Have someone check on you regularly.   GET HELP RIGHT AWAY IF: You cannot keep down liquids or your medications. You become short of breath Your fell like you are going to pass out or loose consciousness. Your symptoms persist after you have completed your treatment plan MAKE SURE YOU  Understand these instructions. Will watch your condition. Will get help right away if you are not doing well or get worse.  Your e-visit answers were reviewed by a board certified advanced clinical practitioner to complete your  personal care plan.  Depending on the condition, your plan could have included both over the counter or prescription medications.  If there is a problem please reply  once you have received a response from your provider.  Your safety is important to [place].  If you have drug allergies check your prescription carefully.    You can use MyChart to ask questions about today's visit, request a non-urgent call  back, or ask for a work or school excuse for 24 hours related to this e-Visit. If it has been greater than 24 hours you will need to follow up with your provider, or enter a new e-Visit to address those concerns.  You will get an e-mail in the next two days asking about your experience.  I hope that your e-visit has been valuable and will speed your recovery. Thank you for using e-visits.

## 2023-08-04 NOTE — Progress Notes (Signed)
I have spent 5 minutes in review of e-visit questionnaire, review and updating patient chart, medical decision making and response to patient.   Piedad Climes, PA-C

## 2024-01-11 NOTE — Progress Notes (Signed)
 LILLETTE Kristeen JINNY Gladis, CMA,acting as a Neurosurgeon for Gaines Ada, FNP.,have documented all relevant documentation on the behalf of Gaines Ada, FNP,as directed by  Gaines Ada, FNP while in the presence of Gaines Ada, FNP.  Subjective:   Patient ID: Jermaine Blackwell , male    DOB: [birth year] , 23 y.o.   MRN: [id number]  Chief Complaint  Patient presents with   Annual Exam    Patient presents today for HM, Patient reports compliance with medication. Patient denies any chest pain, SOB, or headaches. Patient reports his stomach started hurting last night and he had one episode of diarrhea.       HPI  Discussed the use of AI scribe software for clinical note transcription with the patient, who gave verbal consent to proceed.  History of Present Illness Jermaine Blackwell is a 23 year old male who presents for an annual physical exam.  He experienced an episode of diarrhea with accompanying stomach pain, which was more severe the previous night but has improved today. No recent dietary changes or exposure to sick contacts. He maintains hydration with water and has no nausea, vomiting, fever, or urinary issues. He reports a slight feeling of nausea attributed to anxiety.  His depression screening score has increased from five last year to ten today. He notes changes in concentration, appetite, and sleep patterns, feeling more tired and down. He attributes some of these changes to not working and being out of school for the summer.  He follows a pescatarian diet, although not strictly, and acknowledges low water intake. He does not exercise regularly and drinks alcohol occasionally. He is single and has no changes in family history since the last visit.   Past Medical History:  Diagnosis Date   ADD (attention deficit disorder with hyperactivity)    Anxiety    Depression    GERD (gastroesophageal reflux disease)    might be stress related   IBS (irritable bowel syndrome)    Seasonal allergies       Family History  Problem Relation Age of Onset   Anxiety disorder Mother    Colitis Mother    Lactose intolerance Mother    Diabetes Father    Hypertension Father    Bipolar disorder Maternal Grandmother    Depression Paternal Grandmother    Diabetes Paternal Grandfather    Gallstones Paternal Grandfather     No current outpatient medications on file.   No Known Allergies   Men's preventive visit. Patient Health Questionnaire (PHQ-2) is  Flowsheet Row Office Visit from 01/12/2024 in [facility]  PHQ-2 Total Score 2  Patient is on a Pescatarian diet. Admits could be drinking water more regularly.  Exercise -  admits to not exercising regularly. Marital status: Single. Relevant history for alcohol use is:   Social History   Substance and Sexual Activity  Alcohol Use Yes   Comment: occ  Relevant history for tobacco use is:  Social History   Tobacco Use  Smoking Status Never  Smokeless Tobacco Never  .   Review of Systems  Constitutional: Negative.   HENT: Negative.    Eyes: Negative.   Respiratory: Negative.    Cardiovascular: Negative.   Gastrointestinal: Negative.   Endocrine: Negative.   Genitourinary: Negative.   Musculoskeletal: Negative.   Allergic/Immunologic: Negative.   Neurological: Negative.   Hematological: Negative.   Psychiatric/Behavioral: Negative.       Today's Vitals   01/12/24 1003  BP: 110/70  Pulse: 60  Temp: 98.8 F (37.1 C)  TempSrc: Oral  Weight: 154 lb 6.4 oz (70 kg)  Height: 5' 7 (1.702 m)  PainSc: 2   PainLoc: Abdomen   Body mass index is 24.18 kg/m.  Wt Readings from Last 3 Encounters:  01/12/24 154 lb 6.4 oz (70 kg)  01/06/23 146 lb 6.4 oz (66.4 kg)  12/23/21 149 lb (67.6 kg)    Objective:  Physical Exam Vitals and nursing note reviewed.  Constitutional:      General: He is not in acute distress.    Appearance: Normal appearance.  HENT:     Head: Normocephalic and atraumatic.      Right Ear: Tympanic membrane, ear canal and external ear normal. There is no impacted cerumen.     Left Ear: Tympanic membrane, ear canal and external ear normal. There is no impacted cerumen.     Nose: Nose normal.     Mouth/Throat:     Mouth: Mucous membranes are moist.  Cardiovascular:     Rate and Rhythm: Normal rate and regular rhythm.     Pulses: Normal pulses.     Heart sounds: Normal heart sounds. No murmur heard. Pulmonary:     Effort: Pulmonary effort is normal. No respiratory distress.     Breath sounds: Normal breath sounds. No wheezing.  Abdominal:     General: Abdomen is flat. Bowel sounds are normal. There is no distension.     Palpations: Abdomen is soft.  Musculoskeletal:        General: No swelling or tenderness. Normal range of motion.     Cervical back: Normal range of motion and neck supple. No rigidity.  Skin:    General: Skin is warm and dry.     Capillary Refill: Capillary refill takes less than 2 seconds.     Coloration: Skin is not jaundiced.     Findings: No bruising.  Neurological:     General: No focal deficit present.     Mental Status: He is alert and oriented to person, place, and time.     Cranial Nerves: No cranial nerve deficit.     Motor: No weakness.  Psychiatric:        Mood and Affect: Mood normal.        Behavior: Behavior normal.        Thought Content: Thought content normal.        Judgment: Judgment normal.         Assessment And Plan:    Encounter for annual health examination Assessment & Plan: Uncertain hepatitis B vaccination status. Likely vaccinated in childhood, but confirmation needed. - Check state immunization registry for hepatitis B vaccination record. - If vaccination record not found, check hepatitis B titer to assess immunity.   Generalized anxiety disorder  Current mild episode of major depressive disorder without prior episode (HCC) Assessment & Plan: Worsening depression with increased screening  score. Symptoms include decreased concentration, altered appetite, increased fatigue, and altered sleep patterns. Declined integrative behavioral health program. - Offer integrative behavioral health program for counseling and potential psychiatric evaluation if he changes his mind.   Left upper quadrant abdominal pain -     CBC with Differential/Platelet -     CMP14+EGFR -     Lipase -     Amylase  Functional diarrhea Assessment & Plan: Acute diarrhea of unclear etiology. Differential includes viral gastroenteritis and residual discomfort from diarrhea. - Order baseline labs: liver function, kidney function, hemoglobin, white blood cell count. - Provide  stool sample cup if diarrhea recurs. - Advise hydration with fluids like Gatorade or vitamin water. - Recommend probiotic for gut flora balance. - Instruct to report if diarrhea persists or worsens.  Orders: -     CBC with Differential/Platelet -     CMP14+EGFR -     Ova and parasite examination -     Stool culture       Return for 1 year physical. Patient was given opportunity to ask questions. Patient verbalized understanding of the plan and was able to repeat key elements of the plan. All questions were answered to their satisfaction.   Gaines Ada, FNP  I, Gaines Ada, FNP, have reviewed all documentation for this visit. The documentation on 01/12/24 for the exam, diagnosis, procedures, and orders are all accurate and complete.

## 2024-01-12 ENCOUNTER — Encounter: Payer: Self-pay | Admitting: Nurse Practitioner

## 2024-01-12 ENCOUNTER — Ambulatory Visit: Payer: Self-pay | Admitting: Nurse Practitioner

## 2024-01-12 VITALS — BP 110/70 | HR 60 | Temp 98.8°F | Ht 67.0 in | Wt 154.4 lb

## 2024-01-12 DIAGNOSIS — R1012 Left upper quadrant pain: Secondary | ICD-10-CM

## 2024-01-12 DIAGNOSIS — K591 Functional diarrhea: Secondary | ICD-10-CM | POA: Diagnosis not present

## 2024-01-12 DIAGNOSIS — F320 Major depressive disorder, single episode, mild: Secondary | ICD-10-CM

## 2024-01-12 DIAGNOSIS — Z0000 Encounter for general adult medical examination without abnormal findings: Secondary | ICD-10-CM

## 2024-01-12 DIAGNOSIS — F411 Generalized anxiety disorder: Secondary | ICD-10-CM

## 2024-01-13 LAB — CBC WITH DIFFERENTIAL/PLATELET
Basophils Absolute: 0 x10E3/uL (ref 0.0–0.2)
Basos: 0 %
EOS (ABSOLUTE): 0.3 x10E3/uL (ref 0.0–0.4)
Eos: 4 %
Hematocrit: 52.2 % — ABNORMAL HIGH (ref 37.5–51.0)
Hemoglobin: 17.4 g/dL (ref 13.0–17.7)
Immature Grans (Abs): 0 x10E3/uL (ref 0.0–0.1)
Immature Granulocytes: 0 %
Lymphocytes Absolute: 3.4 x10E3/uL — ABNORMAL HIGH (ref 0.7–3.1)
Lymphs: 45 %
MCH: 28.1 pg (ref 26.6–33.0)
MCHC: 33.3 g/dL (ref 31.5–35.7)
MCV: 84 fL (ref 79–97)
Monocytes Absolute: 0.6 x10E3/uL (ref 0.1–0.9)
Monocytes: 8 %
Neutrophils Absolute: 3.1 x10E3/uL (ref 1.4–7.0)
Neutrophils: 42 %
Platelets: 254 x10E3/uL (ref 150–450)
RBC: 6.19 x10E6/uL — ABNORMAL HIGH (ref 4.14–5.80)
RDW: 13 % (ref 11.6–15.4)
WBC: 7.4 x10E3/uL (ref 3.4–10.8)

## 2024-01-13 LAB — CMP14+EGFR
ALT: 17 IU/L (ref 0–44)
AST: 23 IU/L (ref 0–40)
Albumin: 4.7 g/dL (ref 4.3–5.2)
Alkaline Phosphatase: 67 IU/L (ref 44–121)
BUN/Creatinine Ratio: 11 (ref 9–20)
BUN: 13 mg/dL (ref 6–20)
Bilirubin Total: 0.8 mg/dL (ref 0.0–1.2)
CO2: 21 mmol/L (ref 20–29)
Calcium: 9.7 mg/dL (ref 8.7–10.2)
Chloride: 102 mmol/L (ref 96–106)
Creatinine, Ser: 1.19 mg/dL (ref 0.76–1.27)
Globulin, Total: 2.2 g/dL (ref 1.5–4.5)
Glucose: 96 mg/dL (ref 70–99)
Potassium: 4.7 mmol/L (ref 3.5–5.2)
Sodium: 141 mmol/L (ref 134–144)
Total Protein: 6.9 g/dL (ref 6.0–8.5)
eGFR: 88 mL/min/1.73 (ref >59)

## 2024-01-13 LAB — AMYLASE: Amylase: 107 U/L (ref 31–110)

## 2024-01-13 LAB — LIPASE: Lipase: 159 U/L — ABNORMAL HIGH (ref 13–78)

## 2024-01-16 ENCOUNTER — Ambulatory Visit: Payer: Self-pay | Admitting: Nurse Practitioner

## 2024-01-16 LAB — OVA AND PARASITE EXAMINATION

## 2024-01-16 LAB — STOOL CULTURE: E coli, Shiga toxin Assay: NEGATIVE

## 2024-01-29 ENCOUNTER — Encounter: Payer: Self-pay | Admitting: Nurse Practitioner

## 2024-01-29 DIAGNOSIS — K591 Functional diarrhea: Secondary | ICD-10-CM | POA: Insufficient documentation

## 2024-01-29 DIAGNOSIS — Z0000 Encounter for general adult medical examination without abnormal findings: Secondary | ICD-10-CM | POA: Insufficient documentation

## 2024-01-29 NOTE — Assessment & Plan Note (Signed)
 Worsening depression with increased screening score. Symptoms include decreased concentration, altered appetite, increased fatigue, and altered sleep patterns. Declined integrative behavioral health program. - Offer integrative behavioral health program for counseling and potential psychiatric evaluation if he changes his mind.

## 2024-01-29 NOTE — Assessment & Plan Note (Signed)
 Acute diarrhea of unclear etiology. Differential includes viral gastroenteritis and residual discomfort from diarrhea. - Order baseline labs: liver function, kidney function, hemoglobin, white blood cell count. - Provide stool sample cup if diarrhea recurs. - Advise hydration with fluids like Gatorade or vitamin water. - Recommend probiotic for gut flora balance. - Instruct to report if diarrhea persists or worsens.

## 2024-01-29 NOTE — Assessment & Plan Note (Signed)
 Uncertain hepatitis B vaccination status. Likely vaccinated in childhood, but confirmation needed. - Check state immunization registry for hepatitis B vaccination record. - If vaccination record not found, check hepatitis B titer to assess immunity.

## 2024-04-10 ENCOUNTER — Telehealth: Admitting: Physician Assistant

## 2024-04-10 DIAGNOSIS — J069 Acute upper respiratory infection, unspecified: Secondary | ICD-10-CM | POA: Diagnosis not present

## 2024-04-10 DIAGNOSIS — B9689 Other specified bacterial agents as the cause of diseases classified elsewhere: Secondary | ICD-10-CM | POA: Diagnosis not present

## 2024-04-10 MED ORDER — AMOXICILLIN 500 MG PO TABS: 500.0000 mg | ORAL_TABLET | Freq: Two times a day (BID) | ORAL | 0 refills | Status: AC

## 2024-04-10 NOTE — Progress Notes (Signed)
 We are sorry that you are not feeling well.  Here is how we plan to help!  Based on what you have shared with me it is likely that you have a bacterial pharyngitis/tonsillitis.   I have prescribed Amoxicillin  500 mg twice a day for 10 days. For throat pain, we recommend over the counter oral pain relief medications such as acetaminophen  or aspirin, or anti-inflammatory medications such as ibuprofen  or naproxen sodium. Topical treatments such as oral throat lozenges or sprays may be used as needed. Strep infections are not as easily transmitted as other respiratory infections, however we still recommend that you avoid close contact with loved ones, especially the very young and elderly.  Remember to wash your hands thoroughly throughout the day as this is the number one way to prevent the spread of infection! We also recommend that you periodically wipe down door knobs and counters with disinfectant.   Home Care: Only take medications as instructed by your medical team. Complete the entire course of an antibiotic. Do not take these medications with alcohol. A steam or ultrasonic humidifier can help congestion.  You can place a towel over your head and breathe in the steam from hot water coming from a faucet. Avoid close contact with others, especially the very young and the elderly. Cover your mouth when you cough or sneeze. Always remember to wash your hands.  Get Help Right Away If: You develop worsening fever or sinus pain. You develop a severe head ache or visual changes. Your symptoms persist after you have completed your treatment plan.  Make sure you Understand these instructions. Will watch your condition. Will get help right away if you are not doing well or get worse.  Your e-visit answers were reviewed by a board certified advanced clinical practitioner to complete your personal care plan.  Depending on the condition, your plan could have included both over the counter or  prescription medications.  If there is a problem, please reply once you have received a response from your provider.  Your safety is important to us .  If you have drug allergies check your prescription carefully.    You can use MyChart to ask questions about today's visit, request a non-urgent call back, or ask for a work or school excuse for 24 hours related to this e-Visit. If it has been greater than 24 hours you will need to follow up with your provider, or enter a new e-Visit to address those concerns.  You will get an e-mail in the next two days asking about your experience.  I hope that your e-visit has been valuable and will speed your recovery. Thank you for using e-visits.   I have spent 5 minutes in review of e-visit questionnaire, review and updating patient chart, medical decision making and response to patient.   Elsie Velma Lunger, PA-C

## 2024-04-10 NOTE — Progress Notes (Signed)
 Message sent to patient requesting further input regarding current symptoms. Awaiting patient response.

## 2025-01-16 ENCOUNTER — Encounter: Payer: Self-pay | Admitting: Nurse Practitioner
